# Patient Record
Sex: Male | Born: 1980
Health system: Southern US, Community
[De-identification: ages and names within clinical notes are randomized; demographics above are authoritative.]

## PROBLEM LIST (undated history)

## (undated) DIAGNOSIS — R1013 Epigastric pain: Secondary | ICD-10-CM

## (undated) DIAGNOSIS — R7303 Prediabetes: Secondary | ICD-10-CM

## (undated) HISTORY — DX: Prediabetes: R73.03

## (undated) HISTORY — PX: OTHER SURGICAL HISTORY: SHX169

## (undated) HISTORY — DX: Epigastric pain: R10.13

---

## 2013-05-31 HISTORY — PX: WISDOM TOOTH EXTRACTION: SHX21

## 2014-08-08 ENCOUNTER — Ambulatory Visit (INDEPENDENT_AMBULATORY_CARE_PROVIDER_SITE_OTHER): Payer: BLUE CROSS/BLUE SHIELD | Admitting: Family Medicine

## 2014-08-08 ENCOUNTER — Ambulatory Visit (INDEPENDENT_AMBULATORY_CARE_PROVIDER_SITE_OTHER): Payer: BLUE CROSS/BLUE SHIELD

## 2014-08-08 VITALS — BP 106/78 | HR 89 | Temp 98.4°F | Resp 12 | Ht 67.0 in | Wt 165.2 lb

## 2014-08-08 DIAGNOSIS — R05 Cough: Secondary | ICD-10-CM

## 2014-08-08 DIAGNOSIS — J209 Acute bronchitis, unspecified: Secondary | ICD-10-CM | POA: Diagnosis not present

## 2014-08-08 DIAGNOSIS — R059 Cough, unspecified: Secondary | ICD-10-CM

## 2014-08-08 MED ORDER — AZITHROMYCIN 250 MG PO TABS
ORAL_TABLET | ORAL | Status: DC
Start: 2014-08-08 — End: 2015-05-31

## 2014-08-08 NOTE — Progress Notes (Signed)
  Joseph Coleman - 34 y.o. male MRN [id number]  Date of birth: Mar 19, 1981  SUBJECTIVE: Chief Complaint  Patient presents with  . Cough    Dry  . Sore Throat    Dry    HPI:   2 weeks of worsening cough, nonproductive.  Subjective malaise and fatigue without fever.  No night sweats.  No TB contact.  No weight loss.  Works as a Quarry manager for [employer].   has tried NSAIDs and OTC therapy without significant improvement  No significant difficulty breathing, chest pain or lower extremity edema.      ROS: per HPI    HISTORY:  Past Medical, Surgical, Social, and Family History reviewed & updated per EMR.  Pertinent Historical Findings include:  reports that he has never smoked. He has never used smokeless tobacco. Otherwise relatively healthy   OBJECTIVE:  VS:   HT:5\' 7"  (170.2 cm)   WT:165 lb 4 oz (74.957 kg)  BMI:25.9          BP:106/78 mmHg  HR:89bpm  TEMP:98.4 F (36.9 C)(Oral)  RESP:98 %  Physical Exam  Constitutional: He is well-developed, well-nourished, and in no distress. No distress.  HENT:  Head: Normocephalic and atraumatic.  Right Ear: External ear normal.  Left Ear: External ear normal.  Eyes: Right eye exhibits no discharge. Left eye exhibits no discharge. No scleral icterus.  Neck: Normal range of motion. Neck supple. No JVD present. No tracheal deviation present.  Cardiovascular: Normal rate, regular rhythm and normal heart sounds.  Exam reveals no gallop and no friction rub.   No murmur heard. Pulmonary/Chest: Effort normal. No respiratory distress. He has no wheezes. He has rales (right lateral faint crackles, improves with coughing). He exhibits no tenderness.  Abdominal: Soft. He exhibits no distension. There is no tenderness. There is no rebound.  Musculoskeletal: He exhibits no edema or tenderness.  Lymphadenopathy:    He has no cervical adenopathy.  Neurological: He is alert.  Moves all 4 extremities spontaneously; no lateralization.    Skin: Skin is warm and dry. He is not diaphoretic.  Psychiatric: Mood, memory, affect and judgment normal.  Vitals reviewed.    DATA OBTAINED DURING VISIT: No results found for this or any previous visit.  Primary Read of Imaging by: Dr. Tamala Julian & Dr. Eldridge Abrahams Chest  08/08/2014  Findings: Increased interstitial markings, no focal consolidation. No air trapping. Normal osseous structures  Impression: The above process is consistent with a viral/atypical process. Otherwise normal X-ray     ASSESSMENT: 1. Cough   2. Acute bronchitis, unspecified organism     PLAN: See problem based charting & AVS for additional documentation.   azithromycin for 2 weeks of symptoms  Symptomatic treatment  Discussed expected clinical course.  > Return if symptoms worsen or fail to improve.

## 2014-08-08 NOTE — Patient Instructions (Signed)

## 2014-08-24 NOTE — Progress Notes (Signed)
History and physical examinations reviewed with Dr. Paulla Fore.  CXR reviewed during visit. Agree with assessment and plan. Kristi Elayne Guerin, M.D. [facility] [address] [place], [place]  [address] [phone] phone [phone] fax

## 2015-05-31 ENCOUNTER — Ambulatory Visit (INDEPENDENT_AMBULATORY_CARE_PROVIDER_SITE_OTHER): Payer: BLUE CROSS/BLUE SHIELD | Admitting: Urgent Care

## 2015-05-31 VITALS — BP 126/80 | HR 92 | Temp 98.3°F | Resp 16 | Ht 68.0 in | Wt 173.0 lb

## 2015-05-31 DIAGNOSIS — R059 Cough, unspecified: Secondary | ICD-10-CM

## 2015-05-31 DIAGNOSIS — J029 Acute pharyngitis, unspecified: Secondary | ICD-10-CM

## 2015-05-31 DIAGNOSIS — R05 Cough: Secondary | ICD-10-CM

## 2015-05-31 DIAGNOSIS — J069 Acute upper respiratory infection, unspecified: Secondary | ICD-10-CM

## 2015-05-31 MED ORDER — HYDROCODONE-HOMATROPINE 5-1.5 MG/5ML PO SYRP: 5.0000 mL | ORAL_SOLUTION | Freq: Every evening | ORAL | Status: DC | PRN

## 2015-05-31 MED ORDER — BENZONATATE 100 MG PO CAPS: 100.0000 mg | ORAL_CAPSULE | Freq: Three times a day (TID) | ORAL | Status: DC | PRN

## 2015-05-31 NOTE — Patient Instructions (Signed)
Upper Respiratory Infection, Adult Most upper respiratory infections (URIs) are a viral infection of the air passages leading to the lungs. A URI affects the nose, throat, and upper air passages. The most common type of URI is nasopharyngitis and is typically referred to as "the common cold." URIs run their course and usually go away on their own. Most of the time, a URI does not require medical attention, but sometimes a bacterial infection in the upper airways can follow a viral infection. This is called a secondary infection. Sinus and middle ear infections are common types of secondary upper respiratory infections. Bacterial pneumonia can also complicate a URI. A URI can worsen asthma and chronic obstructive pulmonary disease (COPD). Sometimes, these complications can require emergency medical care and may be life threatening.  CAUSES Almost all URIs are caused by viruses. A virus is a type of germ and can spread from one person to another.  RISKS FACTORS You may be at risk for a URI if:   You smoke.   You have chronic heart or lung disease.  You have a weakened defense (immune) system.   You are very young or very old.   You have nasal allergies or asthma.  You work in crowded or poorly ventilated areas.  You work in health care facilities or schools. SIGNS AND SYMPTOMS  Symptoms typically develop 2-3 days after you come in contact with a cold virus. Most viral URIs last 7-10 days. However, viral URIs from the influenza virus (flu virus) can last 14-18 days and are typically more severe. Symptoms may include:   Runny or stuffy (congested) nose.   Sneezing.   Cough.   Sore throat.   Headache.   Fatigue.   Fever.   Loss of appetite.   Pain in your forehead, behind your eyes, and over your cheekbones (sinus pain).  Muscle aches.  DIAGNOSIS  Your health care provider may diagnose a URI by:  Physical exam.  Tests to check that your symptoms are not due to  another condition such as:  Strep throat.  Sinusitis.  Pneumonia.  Asthma. TREATMENT  A URI goes away on its own with time. It cannot be cured with medicines, but medicines may be prescribed or recommended to relieve symptoms. Medicines may help:  Reduce your fever.  Reduce your cough.  Relieve nasal congestion. HOME CARE INSTRUCTIONS   Take medicines only as directed by your health care provider.   Gargle warm saltwater or take cough drops to comfort your throat as directed by your health care provider.  Use a warm mist humidifier or inhale steam from a shower to increase air moisture. This may make it easier to breathe.  Drink enough fluid to keep your urine clear or pale yellow.   Eat soups and other clear broths and maintain good nutrition.   Rest as needed.   Return to work when your temperature has returned to normal or as your health care provider advises. You may need to stay home longer to avoid infecting others. You can also use a face mask and careful hand washing to prevent spread of the virus.  Increase the usage of your inhaler if you have asthma.   Do not use any tobacco products, including cigarettes, chewing tobacco, or electronic cigarettes. If you need help quitting, ask your health care provider. PREVENTION  The best way to protect yourself from getting a cold is to practice good hygiene.   Avoid oral or hand contact with people with cold   symptoms.   Wash your hands often if contact occurs.  There is no clear evidence that vitamin C, vitamin E, echinacea, or exercise reduces the chance of developing a cold. However, it is always recommended to get plenty of rest, exercise, and practice good nutrition.  SEEK MEDICAL CARE IF:   You are getting worse rather than better.   Your symptoms are not controlled by medicine.   You have chills.  You have worsening shortness of breath.  You have brown or red mucus.  You have yellow or brown nasal  discharge.  You have pain in your face, especially when you bend forward.  You have a fever.  You have swollen neck glands.  You have pain while swallowing.  You have white areas in the back of your throat. SEEK IMMEDIATE MEDICAL CARE IF:   You have severe or persistent:  Headache.  Ear pain.  Sinus pain.  Chest pain.  You have chronic lung disease and any of the following:  Wheezing.  Prolonged cough.  Coughing up blood.  A change in your usual mucus.  You have a stiff neck.  You have changes in your:  Vision.  Hearing.  Thinking.  Mood. MAKE SURE YOU:   Understand these instructions.  Will watch your condition.  Will get help right away if you are not doing well or get worse.   This information is not intended to replace advice given to you by your health care provider. Make sure you discuss any questions you have with your health care provider.   Document Released: 11/10/2000 Document Revised: 10/01/2014 Document Reviewed: 08/22/2013 Elsevier Interactive Patient Education 2016 Elsevier Inc.  

## 2015-05-31 NOTE — Progress Notes (Signed)
    MRN: [id number] DOB: 1980-06-24  Subjective:   Joseph Coleman is a 34 y.o. male presenting for chief complaint of Sinus Problem and Sore Throat  Reports 4 day history of worsening sore throat, sinus congestion, subjective fever, productive cough, throat congestion, feels eyes heavy. Has tried ibuprofen for chills/fever with some relief. Has also NyQuil and Theraflu with minimal relief. Denies chest pain, shob, chest tightness, n/v, abdominal pain, itchy or red eyes, ear pain, sinus pain, tooth pain. Has a history of allergies, does not take medications for this consistently. Denies history of asthma. Denies smoking cigarettes.  Joseph Coleman currently has no medications in their medication list. Also has No Known Allergies.  Joseph Coleman  has no past medical history on file. Also  has no past surgical history on file.  Objective:   Vitals: BP 126/80 mmHg  Pulse 92  Temp(Src) 98.3 F (36.8 C) (Oral)  Resp 16  Ht 5\' 8"  (1.727 m)  Wt 173 lb (78.472 kg)  BMI 26.31 kg/m2  SpO2 99%  Physical Exam  Constitutional: He is oriented to person, place, and time. He appears well-developed and well-nourished.  HENT:  TM's intact bilaterally, no effusions or erythema. Nasal turbinates inflamed with mucus. No sinus tenderness. Slight postnasal drip present, without oropharyngeal exudates, erythema or abscesses.  Eyes: Right eye exhibits no discharge. Left eye exhibits no discharge. No scleral icterus.  Neck: Normal range of motion. Neck supple.  Cardiovascular: Normal rate, regular rhythm and intact distal pulses.  Exam reveals no gallop and no friction rub.   No murmur heard. Pulmonary/Chest: No respiratory distress. He has no wheezes. He has no rales.  Lymphadenopathy:    He has no cervical adenopathy.  Neurological: He is alert and oriented to person, place, and time.  Skin: Skin is warm and dry. No rash noted. No erythema. No pallor.   Assessment and Plan :   1. Upper respiratory infection 2.  Cough 3. Sore throat - Likely undergoing viral syndrome. Recommended supportive care, Hycodan and Tessalon for cough and sore throat. Rtc in 1 week if no improvement.  Jaynee Eagles, PA-C [facility] [phone] 05/31/2015 9:16 AM

## 2015-06-01 DIAGNOSIS — R1013 Epigastric pain: Secondary | ICD-10-CM

## 2015-06-01 HISTORY — DX: Epigastric pain: R10.13

## 2015-06-03 ENCOUNTER — Telehealth: Payer: Self-pay

## 2015-06-03 NOTE — Telephone Encounter (Signed)
Pt states the medication he was given isn't helping at all, would like to speak with someone regarding his symptoms and perhaps call him something else in Please call [facility]

## 2015-06-04 NOTE — Telephone Encounter (Signed)
Spoke with pt, he states he he feeling better.

## 2015-12-31 ENCOUNTER — Emergency Department (HOSPITAL_BASED_OUTPATIENT_CLINIC_OR_DEPARTMENT_OTHER)
Admission: EM | Admit: 2015-12-31 | Discharge: 2015-12-31 | Disposition: A | Discharge status: Home / Self Care | Payer: BLUE CROSS/BLUE SHIELD | Attending: Physician Assistant | Admitting: Physician Assistant

## 2015-12-31 ENCOUNTER — Encounter (HOSPITAL_BASED_OUTPATIENT_CLINIC_OR_DEPARTMENT_OTHER): Payer: Self-pay | Admitting: *Deleted

## 2015-12-31 DIAGNOSIS — R1013 Epigastric pain: Secondary | ICD-10-CM | POA: Insufficient documentation

## 2015-12-31 MED ORDER — OMEPRAZOLE 20 MG PO CPDR: 20.0000 mg | DELAYED_RELEASE_CAPSULE | Freq: Every day | ORAL | 5 refills | Status: DC

## 2015-12-31 MED FILL — OMEPRAZOLE DR 20 MG CAPSULE: 20 | 30 days supply | Qty: 30 | Fill #0

## 2015-12-31 NOTE — Discharge Instructions (Signed)
Read the information below.   EKG shows a normal rhythm. I am prescribing an proton pump inhibitor to help with the epigastric pain. Take as directed.  Use the prescribed medication as directed.  Please discuss all new medications with your pharmacist.   Be sure to follow up with your primary care provider next week as stated for re-evaluation.  You may return to the Emergency Department at any time for worsening condition or any new symptoms that concern you. Return to ED if you develop chest pain, shortness of breath, vomiting, blood in stool, blood in vomit, loss of consciousness.

## 2015-12-31 NOTE — ED Triage Notes (Signed)
Pt reports epigastric pressure x Monday. Pt states "it's like when you eat something and it gets stuck..." denies any other c/o at this time.

## 2015-12-31 NOTE — ED Provider Notes (Signed)
[facility] DEPT [facility] Provider Note   CSN: [id number] Arrival date & time: 12/31/15  1019  First Provider Contact:  First MD Initiated Contact with Patient 12/31/15 1106        History   Chief Complaint Chief Complaint  Patient presents with  . Abdominal Pain    HPI Joseph Coleman is a 35 y.o. male.  Joseph Coleman is a 35 y.o. male presents to the emergency department with complaint of epigastric pain. Patient states that pain started on Monday while he was doing laundry after drinking coffee. The pain is intermittent and described as "swallowed something large and lingering" in the lower esophagus. Patient denies radiation of pain. No fever, chest pain, shortness of breath, sore throat, changes in vision, nausea, vomiting, diarrhea, blood in stool, dysuria, hematuria, rashes, headache, neck pain, back pain. No trauma. Patient does endorse some spicy food over the weekend while he was out of town. He endorses that and affect seem to help a little bit. Patient states pain seems to be worse first thing in the morning. Unclear if food affects severity of pain. No history of acid reflux. No history of cardiac conditions. No chronic medical conditions.      History reviewed. No pertinent past medical history.  There are no active problems to display for this patient.   History reviewed. No pertinent surgical history.     Home Medications    Prior to Admission medications   Not on File    Family History Family History  Problem Relation Age of Onset  . Diabetes Father     Social History Social History  Substance Use Topics  . Smoking status: Never Smoker  . Smokeless tobacco: Never Used  . Alcohol use No     Allergies   Review of patient's allergies indicates no known allergies.   Review of Systems Review of Systems  Gastrointestinal: Positive for abdominal pain.  All other systems reviewed and are negative.    Physical Exam Updated Vital Signs BP  128/88 (BP Location: Left Arm)   Pulse 77   Temp 97.8 F (36.6 C) (Oral)   Resp 18   Ht 5\' 8"  (1.727 m)   Wt 80.7 kg   SpO2 100%   BMI 27.06 kg/m   Physical Exam  Constitutional: He appears well-developed and well-nourished. No distress.  HENT:  Head: Normocephalic and atraumatic.  Mouth/Throat: Oropharynx is clear and moist. No oropharyngeal exudate.  Eyes: Conjunctivae and EOM are normal. Pupils are equal, round, and reactive to light. Right eye exhibits no discharge. Left eye exhibits no discharge. No scleral icterus.  Neck: Normal range of motion. Neck supple.  Cardiovascular: Normal rate, regular rhythm, normal heart sounds and intact distal pulses.   No murmur heard. Pulmonary/Chest: Effort normal and breath sounds normal. No respiratory distress.  Abdominal: Soft. Bowel sounds are normal. There is no tenderness. There is no rebound and no guarding.  Musculoskeletal: Normal range of motion.  Lymphadenopathy:    He has no cervical adenopathy.  Neurological: He is alert. Coordination normal.  Skin: Skin is warm and dry. He is not diaphoretic.  Psychiatric: He has a normal mood and affect. His behavior is normal.     ED Treatments / Results  Labs (all labs ordered are listed, but only abnormal results are displayed) Labs Reviewed - No data to display  EKG  EKG Interpretation  Date/Time:  Wednesday December 31 2015 10:33:08 EDT Ventricular Rate:  84 PR Interval:    QRS Duration: 90  QT Interval:  346 QTC Calculation: 409 R Axis:   89 Text Interpretation:  Sinus rhythm Baseline wander in lead(s) I Normal sinus rhythm Confirmed by Thomasene Lot, [place] ([id number]) on 12/31/2015 11:17:05 AM       Radiology No results found.  Procedures Procedures (including critical care time)  Medications Ordered in ED Medications - No data to display   Initial Impression / Assessment and Plan / ED Course  I have reviewed the triage vital signs and the nursing notes.  Pertinent labs  & imaging results that were available during my care of the patient were reviewed by me and considered in my medical decision making (see chart for details).  Clinical Course    Patient presents to ED with complaint of intermittent epigastric pain. He denies chest pain and SOB. No chronic medical conditions. Patient is afebrile and nontoxic-appearing in NAD. Vital signs are stable. Physical exam is reassuring - normal rate and rhythm, no murmurs, lungs are clear to auscultation, abdomen is soft and nontender with positive bowel sounds.. EKG shows normal sinus rhythm. Given history and physical exam, suspect this may be acid reflux vs esophageal spasm. Low suspicion for cholecystitis, pancreatitis, PNA, AMI. Will treat with omeprazole. Patient is scheduled to see PCP next Tuesday. Discussed return precautions. Patient voices understanding and is agreeable.    Final Clinical Impressions(s) / ED Diagnoses   Final diagnoses:  Epigastric abdominal pain    New Prescriptions New Prescriptions   No medications on file     Roxanna Mew, PA-C 12/31/15 [place], MD 12/31/15 1451

## 2016-01-05 ENCOUNTER — Encounter: Payer: Self-pay | Admitting: Behavioral Health

## 2016-01-05 ENCOUNTER — Telehealth: Payer: Self-pay | Admitting: Behavioral Health

## 2016-01-05 NOTE — Telephone Encounter (Signed)
Pre-Visit Call completed with patient and chart updated.   Pre-Visit Info documented in Specialty Comments under [facility].    

## 2016-01-06 ENCOUNTER — Ambulatory Visit (INDEPENDENT_AMBULATORY_CARE_PROVIDER_SITE_OTHER): Payer: BLUE CROSS/BLUE SHIELD | Admitting: Physician Assistant

## 2016-01-06 ENCOUNTER — Encounter: Payer: Self-pay | Admitting: Physician Assistant

## 2016-01-06 VITALS — BP 96/68 | HR 89 | Temp 98.1°F | Resp 16 | Ht 68.0 in | Wt 175.2 lb

## 2016-01-06 DIAGNOSIS — K219 Gastro-esophageal reflux disease without esophagitis: Secondary | ICD-10-CM | POA: Diagnosis not present

## 2016-01-06 MED ORDER — RANITIDINE HCL 150 MG PO TABS: 150.0000 mg | ORAL_TABLET | Freq: Two times a day (BID) | ORAL | 0 refills | Status: DC

## 2016-01-06 MED ORDER — CLOTRIMAZOLE-BETAMETHASONE 1-0.05 % EX CREA: 1.0000 "application " | TOPICAL_CREAM | Freq: Two times a day (BID) | CUTANEOUS | 0 refills | Status: DC

## 2016-01-06 NOTE — Patient Instructions (Addendum)
Please stop the Prilosec. Start the Ranitidine twice daily over the next two weeks. Follow the diet below.  Apply the cream to the chest as directed. Follow-up if symptoms are not resolving.  Food Choices for Gastroesophageal Reflux Disease, Adult When you have gastroesophageal reflux disease (GERD), the foods you eat and your eating habits are very important. Choosing the right foods can help ease the discomfort of GERD. WHAT GENERAL GUIDELINES DO I NEED TO FOLLOW?  Choose fruits, vegetables, whole grains, low-fat dairy products, and low-fat meat, fish, and poultry.  Limit fats such as oils, salad dressings, butter, nuts, and avocado.  Keep a food diary to identify foods that cause symptoms.  Avoid foods that cause reflux. These may be different for different people.  Eat frequent small meals instead of three large meals each day.  Eat your meals slowly, in a relaxed setting.  Limit fried foods.  Cook foods using methods other than frying.  Avoid drinking alcohol.  Avoid drinking large amounts of liquids with your meals.  Avoid bending over or lying down until 2-3 hours after eating. WHAT FOODS ARE NOT RECOMMENDED? The following are some foods and drinks that may worsen your symptoms: Vegetables Tomatoes. Tomato juice. Tomato and spaghetti sauce. Chili peppers. Onion and garlic. Horseradish. Fruits Oranges, grapefruit, and lemon (fruit and juice). Meats High-fat meats, fish, and poultry. This includes hot dogs, ribs, ham, sausage, salami, and bacon. Dairy Whole milk and chocolate milk. Sour cream. Cream. Butter. Ice cream. Cream cheese.  Beverages Coffee and tea, with or without caffeine. Carbonated beverages or energy drinks. Condiments Hot sauce. Barbecue sauce.  Sweets/Desserts Chocolate and cocoa. Donuts. Peppermint and spearmint. Fats and Oils High-fat foods, including [place] fries and potato chips. Other Vinegar. Strong spices, such as black pepper, white  pepper, red pepper, cayenne, curry powder, cloves, ginger, and chili powder. The items listed above may not be a complete list of foods and beverages to avoid. Contact your dietitian for more information.   This information is not intended to replace advice given to you by your health care provider. Make sure you discuss any questions you have with your health care provider.   Document Released: 05/17/2005 Document Revised: 06/07/2014 Document Reviewed: 03/21/2013 Elsevier Interactive Patient Education [employer].

## 2016-01-06 NOTE — Progress Notes (Signed)
Patient presents to clinic today to establish care.  Acute Concerns: Patient is also an ER follow-up. Presented to ER on 12/31/2015 c/o heartburn and epigastric discomfort.  ER workup included EKG revealing normal sinus rhythm. Giving patient's normal EKG and low risk for acute abdomen, patient was started on omeprazole 20 mg daily for GERD. Since discharge, patient denies any recurrence of symptoms. He is taking omeprazole daily as directed, but notes it makes him very fatigued. Does note the medication is preventing any heartburn or indigestion. Denies change to bowel habits. Denies melena or hematochezia. Denies fever, chills, malaise or fatigue. Feels very well overall.   Past Medical History:  Diagnosis Date  . Epigastric pain 2017    Past Surgical History:  Procedure Laterality Date  . WISDOM TOOTH EXTRACTION  2015    Current Outpatient Prescriptions on File Prior to Visit  Medication Sig Dispense Refill  . omeprazole (PRILOSEC) 20 MG capsule Take 1 capsule (20 mg total) by mouth daily. 30 capsule 5   No current facility-administered medications on file prior to visit.     No Known Allergies  Family History  Problem Relation Age of Onset  . Hyperlipidemia Mother   . Hypertension Mother   . Diabetes Father   . Alcohol abuse Sister   . Alcohol abuse Brother   . Stroke Maternal Grandfather   . Heart attack Paternal Aunt   . Cerebral palsy Son 8  . Premature birth Son 37  . Healthy Daughter 2  . Cancer Neg Hx     Social History   Social History  . Marital status: Married    Spouse name: N/A  . Number of children: N/A  . Years of education: N/A   Occupational History  . Not on file.   Social History Main Topics  . Smoking status: Never Smoker  . Smokeless tobacco: Never Used  . Alcohol use No  . Drug use: No  . Sexual activity: Not on file   Other Topics Concern  . Not on file   Social History Narrative  . No narrative on file    Review of  Systems  Constitutional: Positive for malaise/fatigue. Negative for chills and fever.  Eyes: Negative for blurred vision.  Respiratory: Negative for cough and shortness of breath.   Cardiovascular: Negative for chest pain and palpitations.  Gastrointestinal: Positive for abdominal pain and heartburn. Negative for blood in stool, constipation, diarrhea, melena, nausea and vomiting.  Genitourinary: Negative for dysuria, flank pain, frequency, hematuria and urgency.  Neurological: Negative for headaches.    BP 96/68 (BP Location: Left Arm, Patient Position: Sitting, Cuff Size: Large)   Pulse 89   Temp 98.1 F (36.7 C) (Oral)   Resp 16   Ht 5\' 8"  (1.727 m)   Wt 175 lb 4 oz (79.5 kg)   SpO2 98%   BMI 26.65 kg/m   Physical Exam  Constitutional: He is oriented to person, place, and time and well-developed, well-nourished, and in no distress.  HENT:  Head: Normocephalic and atraumatic.  Eyes: Conjunctivae are normal.  Neck: Neck supple.  Cardiovascular: Normal rate, regular rhythm, normal heart sounds and intact distal pulses.   Pulmonary/Chest: Effort normal and breath sounds normal. No respiratory distress. He has no wheezes. He has no rales. He exhibits no tenderness.  Abdominal: Soft. Bowel sounds are normal. He exhibits no distension and no mass. There is no tenderness. There is no rebound and no guarding.  Neurological: He is alert and oriented to  person, place, and time.  Skin: Skin is warm and dry. No rash noted.  Psychiatric: Affect normal.  Vitals reviewed.  Assessment/Plan: 1. Gastroesophageal reflux disease without esophagitis Recent flareup as cause of epigastric discomfort. Symptoms have resolved with Prilosec. Unfortunately patient unable to tolerate Prilosec due to side effects. We'll stop and switch to Zantac 150 mg twice daily x 2 weeks, then PRN. Patient to start daily probiotic. GERD diet discussed. Handout given. Follow-up when necessary if symptoms recur. He is also  encouraged to schedule complete physical.   Leeanne Rio, PA-C

## 2017-08-25 DIAGNOSIS — J209 Acute bronchitis, unspecified: Secondary | ICD-10-CM | POA: Diagnosis not present

## 2017-10-19 ENCOUNTER — Encounter: Payer: Self-pay | Admitting: General Practice

## 2017-11-15 ENCOUNTER — Telehealth: Payer: Self-pay | Admitting: *Deleted

## 2017-11-15 NOTE — Telephone Encounter (Signed)
Ok to transfer. But don't change until he actually come in to be seen. He can come in for establish patient visit.if no acute complaints might be able to do wellness exam.

## 2017-11-15 NOTE — Telephone Encounter (Signed)
[place] with me. I have only seen patient once in 2017 for an acute.

## 2017-11-15 NOTE — Telephone Encounter (Signed)
Copied from [place] [phone]. Topic: Appointment Scheduling - Prior Auth Required for Appointment >> Nov 15, 2017 11:02 AM [name] wrote: Patient is requesting transfer of care appointment from [place].  Pt did not know Joseph Coleman had moved to [place] and that is too far for him to go,. Is that ok with you Joseph Coleman? OK Percell Miller?   Thank you!!

## 2017-11-23 ENCOUNTER — Encounter: Payer: BLUE CROSS/BLUE SHIELD | Admitting: Physician Assistant

## 2017-12-07 ENCOUNTER — Encounter: Payer: BLUE CROSS/BLUE SHIELD | Admitting: Medical

## 2018-02-02 DIAGNOSIS — J209 Acute bronchitis, unspecified: Secondary | ICD-10-CM | POA: Diagnosis not present

## 2018-02-14 ENCOUNTER — Ambulatory Visit (INDEPENDENT_AMBULATORY_CARE_PROVIDER_SITE_OTHER): Payer: BLUE CROSS/BLUE SHIELD | Admitting: Medical

## 2018-02-14 ENCOUNTER — Encounter: Payer: Self-pay | Admitting: Medical

## 2018-02-14 VITALS — BP 113/70 | HR 71 | Temp 98.4°F | Resp 16 | Ht 68.0 in | Wt 143.0 lb

## 2018-02-14 DIAGNOSIS — Z113 Encounter for screening for infections with a predominantly sexual mode of transmission: Secondary | ICD-10-CM | POA: Diagnosis not present

## 2018-02-14 DIAGNOSIS — Z0000 Encounter for general adult medical examination without abnormal findings: Secondary | ICD-10-CM | POA: Diagnosis not present

## 2018-02-14 LAB — CBC WITH DIFFERENTIAL/PLATELET
Basophils Absolute: 0 10*3/uL (ref 0.0–0.1)
Basophils Relative: 0.5 % (ref 0.0–3.0)
EOS ABS: 0.1 10*3/uL (ref 0.0–0.7)
Eosinophils Relative: 1.2 % (ref 0.0–5.0)
HCT: 43.7 % (ref 39.0–52.0)
HEMOGLOBIN: 13.9 g/dL (ref 13.0–17.0)
LYMPHS ABS: 2 10*3/uL (ref 0.7–4.0)
Lymphocytes Relative: 31.2 % (ref 12.0–46.0)
MCHC: 31.9 g/dL (ref 30.0–36.0)
MCV: 66.4 fl — ABNORMAL LOW (ref 78.0–100.0)
MONO ABS: 0.6 10*3/uL (ref 0.1–1.0)
Monocytes Relative: 9.9 % (ref 3.0–12.0)
NEUTROS PCT: 57.2 % (ref 43.0–77.0)
Neutro Abs: 3.7 10*3/uL (ref 1.4–7.7)
Platelets: 299 10*3/uL (ref 150.0–400.0)
RBC: 6.58 Mil/uL — AB (ref 4.22–5.81)
RDW: 14.7 % (ref 11.5–15.5)
WBC: 6.4 10*3/uL (ref 4.0–10.5)

## 2018-02-14 LAB — POC URINALSYSI DIPSTICK (AUTOMATED)
Bilirubin, UA: NEGATIVE
GLUCOSE UA: NEGATIVE
Leukocytes, UA: NEGATIVE
NITRITE UA: NEGATIVE
PH UA: 6 (ref 5.0–8.0)
Protein, UA: NEGATIVE
RBC UA: NEGATIVE
Spec Grav, UA: >=1.03 — AB (ref 1.010–1.025)
UROBILINOGEN UA: NEGATIVE U/dL — AB

## 2018-02-14 LAB — LIPID PANEL
CHOLESTEROL: 190 mg/dL (ref 0–200)
HDL: 54.9 mg/dL (ref >39.00)
LDL Cholesterol: 121 mg/dL — ABNORMAL HIGH (ref 0–99)
NonHDL: 134.89
TRIGLYCERIDES: 69 mg/dL (ref 0.0–149.0)
Total CHOL/HDL Ratio: 3
VLDL: 13.8 mg/dL (ref 0.0–40.0)

## 2018-02-14 LAB — COMPREHENSIVE METABOLIC PANEL
ALBUMIN: 4.9 g/dL (ref 3.5–5.2)
ALK PHOS: 56 U/L (ref 39–117)
ALT: 18 U/L (ref 0–53)
AST: 19 U/L (ref 0–37)
BUN: 15 mg/dL (ref 6–23)
CHLORIDE: 102 meq/L (ref 96–112)
CO2: 27 mEq/L (ref 19–32)
Calcium: 10 mg/dL (ref 8.4–10.5)
Creatinine, Ser: 1.06 mg/dL (ref 0.40–1.50)
GFR: 83.02 mL/min (ref >60.00)
GLUCOSE: 92 mg/dL (ref 70–99)
POTASSIUM: 4.2 meq/L (ref 3.5–5.1)
SODIUM: 139 meq/L (ref 135–145)
TOTAL PROTEIN: 7.8 g/dL (ref 6.0–8.3)
Total Bilirubin: 0.9 mg/dL (ref 0.2–1.2)

## 2018-02-14 MED ORDER — HYDROCODONE-HOMATROPINE 5-1.5 MG/5ML PO SYRP: 5.0000 mL | ORAL_SOLUTION | Freq: Three times a day (TID) | ORAL | 0 refills | Status: DC | PRN

## 2018-02-14 NOTE — Patient Instructions (Addendum)
For you wellness exam today I have ordered cbc, cmp, lipid panel, ua and hiv.  Vaccine given today none. Pt declined those today.  Recommend exercise and healthy diet.  We will let you know lab results as they come in.  Follow up date appointment will be determined after lab review.   Will give limited supply of hycodan for your cough so you can sleep. If cough persists past rx please let me know.   Preventive Care 18-39 Years, Male Preventive care refers to lifestyle choices and visits with your health care provider that can promote health and wellness. What does preventive care include?  A yearly physical exam. This is also called an annual well check.  Dental exams once or twice a year.  Routine eye exams. Ask your health care provider how often you should have your eyes checked.  Personal lifestyle choices, including: ? Daily care of your teeth and gums. ? Regular physical activity. ? Eating a healthy diet. ? Avoiding tobacco and drug use. ? Limiting alcohol use. ? Practicing safe sex. What happens during an annual well check? The services and screenings done by your health care provider during your annual well check will depend on your age, overall health, lifestyle risk factors, and family history of disease. Counseling Your health care provider may ask you questions about your:  Alcohol use.  Tobacco use.  Drug use.  Emotional well-being.  Home and relationship well-being.  Sexual activity.  Eating habits.  Work and work Statistician.  Screening You may have the following tests or measurements:  Height, weight, and BMI.  Blood pressure.  Lipid and cholesterol levels. These may be checked every 5 years starting at age 83.  Diabetes screening. This is done by checking your blood sugar (glucose) after you have not eaten for a while (fasting).  Skin check.  Hepatitis C blood test.  Hepatitis B blood test.  Sexually transmitted disease (STD)  testing.  Discuss your test results, treatment options, and if necessary, the need for more tests with your health care provider. Vaccines Your health care provider may recommend certain vaccines, such as:  Influenza vaccine. This is recommended every year.  Tetanus, diphtheria, and acellular pertussis (Tdap, Td) vaccine. You may need a Td booster every 10 years.  Varicella vaccine. You may need this if you have not been vaccinated.  HPV vaccine. If you are 22 or younger, you may need three doses over 6 months.  Measles, mumps, and rubella (MMR) vaccine. You may need at least one dose of MMR.You may also need a second dose.  Pneumococcal 13-valent conjugate (PCV13) vaccine. You may need this if you have certain conditions and have not been vaccinated.  Pneumococcal polysaccharide (PPSV23) vaccine. You may need one or two doses if you smoke cigarettes or if you have certain conditions.  Meningococcal vaccine. One dose is recommended if you are age 83-21 years and a first-year college student living in a residence hall, or if you have one of several medical conditions. You may also need additional booster doses.  Hepatitis A vaccine. You may need this if you have certain conditions or if you travel or work in places where you may be exposed to hepatitis A.  Hepatitis B vaccine. You may need this if you have certain conditions or if you travel or work in places where you may be exposed to hepatitis B.  Haemophilus influenzae type b (Hib) vaccine. You may need this if you have certain risk factors.  Talk  to your health care provider about which screenings and vaccines you need and how often you need them. This information is not intended to replace advice given to you by your health care provider. Make sure you discuss any questions you have with your health care provider. Document Released: 07/13/2001 Document Revised: 02/04/2016 Document Reviewed: 03/18/2015 Elsevier Interactive Patient  Education  [employer].

## 2018-02-14 NOTE — Progress Notes (Signed)
Subjective:    Patient ID: Joseph Coleman, male    DOB: [birth year], 37 y.o.   MRN: [id number]  HPI  Pt in for first time.  Pt states he has been coughing recently(see ros).   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 Pt dad passed away and he was a Archivist. Pt wife just changing jobs.  Pt does exercise regularly.   Pt states tdap done about 5 years ago.  Pt declines flu vaccine.    Review of Systems  Constitutional: Negative for chills, fatigue and fever.  HENT: Negative for congestion, hearing loss, nosebleeds and postnasal drip.   Respiratory: Positive for cough. Negative for wheezing.        He updates me that he has 2 weeks of chest congestion and cough. Pt went to UC. He was first treated with azithromycin then treated with levofloxin. Pt given benzonatate. Also prednisone taper course.   He states some residual cough now but getting better now.  Cough still disrupts his sleep.  Cardiovascular: Negative for chest pain.  Gastrointestinal: Negative for abdominal distention, abdominal pain, blood in stool, constipation, nausea and vomiting.  Genitourinary: Negative for dysuria, flank pain and frequency.  Musculoskeletal: Negative for back pain, myalgias and neck pain.  Skin: Negative for rash.  Neurological: Negative for dizziness, speech difficulty, weakness, numbness and headaches.  Hematological: Negative for adenopathy. Does not bruise/bleed easily.  Psychiatric/Behavioral: Negative for behavioral problems, confusion, dysphoric mood, sleep disturbance and suicidal ideas. The patient is not nervous/anxious.     Past Medical History:  Diagnosis Date  . Epigastric pain 2017     Social History   Socioeconomic History  . Marital status: Married    Spouse name: Not on file  . Number of children: 2  . Years of education: Not on file  . Highest education level: Not on file  Occupational History  . Not on file  Social Needs  . Financial  resource strain: Not on file  . Food insecurity:    Worry: Not on file    Inability: Not on file  . Transportation needs:    Medical: Not on file    Non-medical: Not on file  Tobacco Use  . Smoking status: Never Smoker  . Smokeless tobacco: Never Used  Substance and Sexual Activity  . Alcohol use: No    Alcohol/week: 0.0 standard drinks  . Drug use: No  . Sexual activity: Yes    Partners: Female  Lifestyle  . Physical activity:    Days per week: Not on file    Minutes per session: Not on file  . Stress: Not on file  Relationships  . Social connections:    Talks on phone: Not on file    Gets together: Not on file    Attends religious service: Not on file    Active member of club or organization: Not on file    Attends meetings of clubs or organizations: Not on file    Relationship status: Not on file  . Intimate partner violence:    Fear of current or ex partner: Not on file    Emotionally abused: Not on file    Physically abused: Not on file    Forced sexual activity: Not on file  Other Topics Concern  . Not on file  Social History Narrative  . Not on file    Past Surgical History:  Procedure Laterality Date  . lipoma removal    .  WISDOM TOOTH EXTRACTION  2015    Family History  Problem Relation Age of Onset  . Hyperlipidemia Mother   . Hypertension Mother   . Diabetes Father   . Alcohol abuse Sister   . Stroke Maternal Grandfather   . Heart attack Paternal Aunt   . Cerebral palsy Son 8  . Premature birth Son 79  . Healthy Daughter 2  . Cancer Neg Hx     No Known Allergies  Current Outpatient Medications on File Prior to Visit  Medication Sig Dispense Refill  . benzonatate (TESSALON) 200 MG capsule TK 1 C PO TID PRF COUGH  0  . clotrimazole-betamethasone (LOTRISONE) cream Apply 1 application topically 2 (two) times daily. 30 g 0  . levofloxacin (LEVAQUIN) 500 MG tablet   0  . ranitidine (ZANTAC) 150 MG tablet Take 1 tablet (150 mg total) by mouth 2  (two) times daily. 60 tablet 0   No current facility-administered medications on file prior to visit.     BP 113/70   Pulse 71   Temp 98.4 F (36.9 C) (Oral)   Resp 16   Ht 5\' 8"  (1.727 m)   Wt 143 lb (64.9 kg)   SpO2 100%   BMI 21.74 kg/m       Objective:   Physical Exam  General Mental Status- Alert. General Appearance- Not in acute distress.   Skin General: Color- Normal Color. Moisture- Normal Moisture.  Neck Carotid Arteries- Normal color. Moisture- Normal Moisture. No carotid bruits. No JVD.  Chest and Lung Exam Auscultation: Breath Sounds:-Normal.  Cardiovascular Auscultation:Rythm- Regular. Murmurs & Other Heart Sounds:Auscultation of the heart reveals- No Murmurs.  Abdomen Inspection:-Inspeection Normal. Palpation/Percussion:Note:No mass. Palpation and Percussion of the abdomen reveal- Non Tender, Non Distended + BS, no rebound or guarding.  Neurologic Cranial Nerve exam:- CN III-XII intact(No nystagmus), symmetric smile. Drift Test:- No drift. Romberg Exam:- Negative.  Heal to Toe Gait exam:-Normal. Finger to Nose:- Normal/Intact Strength:- 5/5 equal and symmetric strength both upper and lower extremities.      Assessment & Plan:  For you wellness exam today I have ordered cbc, cmp, lipid panel, ua and hiv.  Vaccine given today none. Pt declined those today.  Recommend exercise and healthy diet.  We will let you know lab results as they come in.  Follow up date appointment will be determined after lab review.   Will give limited supply of hycodan for your cough so you can sleep.  Mackie Pai, PA-C

## 2018-02-15 LAB — HIV ANTIBODY (ROUTINE TESTING W REFLEX): HIV 1&2 Ab, 4th Generation: NONREACTIVE

## 2018-02-23 ENCOUNTER — Telehealth: Payer: Self-pay | Admitting: Medical

## 2018-02-23 NOTE — Telephone Encounter (Signed)
Copied from [place] [id number]. Topic: Quick Communication - See Telephone Encounter >> Feb 23, 2018  8:31 AM Alfredia Ferguson R wrote: Pt called in stating that he still is having the rough cough and was told to call back in and advise Dr Harvie Heck if cough hasnt gotten any better. He states he wants to know if Dr Harvie Heck wants him to come back in to be seen by him or can he send something else to help him with the cough.  Cb# [phone] (mobile)

## 2018-02-24 MED ORDER — HYDROCODONE-HOMATROPINE 5-1.5 MG/5ML PO SYRP: 5.0000 mL | ORAL_SOLUTION | Freq: Three times a day (TID) | ORAL | 0 refills | Status: DC | PRN

## 2018-02-24 NOTE — Telephone Encounter (Signed)
Talked with pt. Rx'd hycodan and asked to call to schedule appointment on Monday afternoon. Plan to recheck and get chest xray.

## 2018-04-26 ENCOUNTER — Encounter: Payer: Self-pay | Admitting: Family Medicine

## 2018-04-26 ENCOUNTER — Ambulatory Visit: Payer: BLUE CROSS/BLUE SHIELD | Admitting: Family Medicine

## 2018-04-26 VITALS — BP 110/78 | HR 78 | Temp 98.2°F | Ht 68.0 in | Wt 155.1 lb

## 2018-04-26 DIAGNOSIS — J069 Acute upper respiratory infection, unspecified: Secondary | ICD-10-CM | POA: Diagnosis not present

## 2018-04-26 DIAGNOSIS — B9789 Other viral agents as the cause of diseases classified elsewhere: Secondary | ICD-10-CM

## 2018-04-26 MED ORDER — PROMETHAZINE-DM 6.25-15 MG/5ML PO SYRP: 5.0000 mL | ORAL_SOLUTION | Freq: Four times a day (QID) | ORAL | 0 refills | Status: DC | PRN

## 2018-04-26 MED ORDER — BENZONATATE 200 MG PO CAPS: ORAL_CAPSULE | ORAL | 0 refills | Status: DC

## 2018-04-26 NOTE — Patient Instructions (Signed)
Continue to push fluids, practice good hand hygiene, and cover your mouth if you cough.  If you start having fevers, shaking or shortness of breath, seek immediate care.  For symptoms, consider using Vick's VapoRub on chest or under nose, air humidifier, Benadryl at night, and elevating the head of the bed. Tylenol and ibuprofen for aches and pains you may be experiencing.   Let us know if you need anything.  

## 2018-04-26 NOTE — Progress Notes (Signed)
Pre visit review using our clinic review tool, if applicable. No additional management support is needed unless otherwise documented below in the visit note. 

## 2018-04-26 NOTE — Progress Notes (Signed)
Chief Complaint  Patient presents with  . Cough  . Back Pain    Joseph Coleman here for URI complaints.  Duration: 3 days  Associated symptoms: sore throat and cough Denies: sinus congestion, sinus pain, rhinorrhea, itchy watery eyes, ear pain, ear drainage, wheezing, shortness of breath, myalgia and fevers Treatment to date: OTC cough med Sick contacts: Yes  ROS:  Const: Denies fevers HEENT: As noted in HPI Lungs: No SOB  Past Medical History:  Diagnosis Date  . Epigastric pain 2017    BP 110/78 (BP Location: Left Arm, Patient Position: Sitting, Cuff Size: Normal)   Pulse 78   Temp 98.2 F (36.8 C) (Oral)   Ht 5\' 8"  (1.727 m)   Wt 155 lb 2 oz (70.4 kg)   SpO2 99%   BMI 23.59 kg/m  General: Awake, alert, appears stated age HEENT: AT, [place], ears patent b/l and TM's neg, nares patent w/o discharge, pharynx pink and without exudates, MMM Neck: No masses or asymmetry Heart: RRR Lungs: CTAB, no accessory muscle use Psych: Age appropriate judgment and insight, normal mood and affect  Viral URI with cough - Plan: promethazine-dextromethorphan (PROMETHAZINE-DM) 6.25-15 MG/5ML syrup, benzonatate (TESSALON) 200 MG capsule  Orders as above. Continue to push fluids, practice good hand hygiene, cover mouth when coughing. F/u prn. If starting to experience fevers, shaking, or shortness of breath, seek immediate care. Pt voiced understanding and agreement to the plan.  [place], DO 04/26/18 10:51 AM

## 2018-05-05 ENCOUNTER — Ambulatory Visit: Payer: Self-pay

## 2018-05-05 NOTE — Telephone Encounter (Signed)
Ret'd call to pt.  Reported his cough continues x 2 weeks.  Coughing up green phlegm at intervals.  Stated recent low grade fever of 99.5.  Denied shortness of breath, wheezing, or chest tightness.  Stated he doesn't have much nasal congestion.  Denied sinus congestion.  Reported low back pain; associated the back pain with continued coughing.  Reported he has been following recommendations and taking medications per Dr. Nani Ravens, from office eval. On 11/27.  Stated he is only slightly better.  Voiced concern of the green phlegm, and of needing to get healthy, as his wife is very close to having a C-Section.  Advised no available appts. at PCP office today, and offered to sched. appt. at another [place] office.  Pt. declined being sched. at a different office. Stated "I'll just ride it out."  Offered an appt. On Mon. 12/9; pt. Agreed with 12/9, 4:00 PM appt.  Care advice given per protocol.  Verb. Understanding.           Reason for Disposition . [1] Continuous (nonstop) coughing interferes with work or school AND [2] no improvement using cough treatment per Care Advice  Answer Assessment - Initial Assessment Questions 1. ONSET: "When did the cough begin?"      About 2 weeks  2. SEVERITY: "How bad is the cough today?"      Moderate  3. RESPIRATORY DISTRESS: "Describe your breathing."      Denied any shortness of breath  4. FEVER: "Do you have a fever?" If so, ask: "What is your temperature, how was it measured, and when did it start?"     Low grade fever of 99.5 5. SPUTUM: "Describe the color of your sputum" (clear, white, yellow, green)     Green phlegm  6. HEMOPTYSIS: "Are you coughing up any blood?" If so ask: "How much?" (flecks, streaks, tablespoons, etc.)     Denied  7. CARDIAC HISTORY: "Do you have any history of heart disease?" (e.g., heart attack, congestive heart failure)      Denied  8. LUNG HISTORY: "Do you have any history of lung disease?"  (e.g., pulmonary embolus, asthma,  emphysema)    Denied  9. PE RISK FACTORS: "Do you have a history of blood clots?" (or: recent major surgery, recent prolonged travel, bedridden)     Denied 10. OTHER SYMPTOMS: "Do you have any other symptoms?" (e.g., runny nose, wheezing, chest pain)       Denied nasal congestion, or wheezing ; denied chest pain; c/o low back pain with freq. cough   11. PREGNANCY: "Is there any chance you are pregnant?" "When was your last menstrual period?"       n/a 12. TRAVEL: "Have you traveled out of the country in the last month?" (e.g., travel history, exposures)       N/a  Protocols used: [place]

## 2018-05-08 ENCOUNTER — Ambulatory Visit: Payer: BLUE CROSS/BLUE SHIELD | Admitting: Medical

## 2018-05-11 ENCOUNTER — Encounter: Payer: Self-pay | Admitting: Medical

## 2018-05-11 ENCOUNTER — Ambulatory Visit: Payer: BLUE CROSS/BLUE SHIELD | Admitting: Medical

## 2018-05-11 ENCOUNTER — Ambulatory Visit (HOSPITAL_BASED_OUTPATIENT_CLINIC_OR_DEPARTMENT_OTHER)
Admission: RE | Admit: 2018-05-11 | Discharge: 2018-05-11 | Disposition: A | Discharge status: Home / Self Care | Payer: BLUE CROSS/BLUE SHIELD | Source: Ambulatory Visit | Attending: Medical | Admitting: Medical

## 2018-05-11 VITALS — BP 124/67 | HR 83 | Temp 97.6°F | Resp 16 | Ht 68.0 in | Wt 156.4 lb

## 2018-05-11 DIAGNOSIS — M25551 Pain in right hip: Secondary | ICD-10-CM

## 2018-05-11 MED ORDER — DICLOFENAC SODIUM 75 MG PO TBEC: 75.0000 mg | DELAYED_RELEASE_TABLET | Freq: Two times a day (BID) | ORAL | 0 refills | Status: DC

## 2018-05-11 NOTE — Patient Instructions (Signed)
You do have some right hip region pain over the last 10 to 14 days approximate.  Along with this you also appear to have hamstring pain on exam.  Will continue to  rest this area and not do any lower body exercises.  You can use ibuprofen 600 to 800 mg every 8 hours for pain.  If this is not adequate that I did make diclofenac NSAID available to use.  However you cannot use both at the same time.  Would recommend that you put warm compress underneath your right thigh/hamstring area twice a day for 10 to 15 minutes.  This might help relax/heal potential pulled muscle.   Please get x-ray of right hip today.  If pain in the area persists another 7 days then would refer to sports medicine.  Follow-up in 7 days or as needed.

## 2018-05-11 NOTE — Progress Notes (Signed)
Subjective:    Patient ID: Joseph Coleman, male    DOB: [birth year], 37 y.o.   MRN: [id number]  HPI  Pt in states 3 weeks ago his lower back went out/started to hurt. He states he helped neighbor move and then went to work out. Pt states for about 4 days he states could hardly move. During that time he noted his rt hip area felt like it popped when he was lying on his side trying to find a comfortable position for back. Back pain got better about 2.5 weeks ago. But his rt hip pain started as back pain resolved. He speculates that he did not feel hip pain early on with pop since his back was so painful.  Now pain in rt hip hurts worse.   Pt has seen chiropracter about 10 days ago. He went 2 times. Pain is still persisting.  Pt has been taking ibuprofen 200 mg one in am and other in pm.     Review of Systems  Constitutional: Negative for chills, fatigue and fever.  Respiratory: Negative for cough, chest tightness, shortness of breath and wheezing.   Cardiovascular: Negative for chest pain and palpitations.  Gastrointestinal: Negative for abdominal pain.  Genitourinary: Negative for difficulty urinating, enuresis, flank pain and hematuria.  Musculoskeletal: Negative for back pain.       Rt hip pain.  Skin: Negative for rash.  Neurological: Negative for dizziness and light-headedness.  Hematological: Negative for adenopathy.  Psychiatric/Behavioral: Negative for behavioral problems.    Past Medical History:  Diagnosis Date  . Epigastric pain 2017     Social History   Socioeconomic History  . Marital status: Married    Spouse name: Not on file  . Number of children: 2  . Years of education: Not on file  . Highest education level: Not on file  Occupational History  . Not on file  Social Needs  . Financial resource strain: Not on file  . Food insecurity:    Worry: Not on file    Inability: Not on file  . Transportation needs:    Medical: Not on file    Non-medical: Not  on file  Tobacco Use  . Smoking status: Never Smoker  . Smokeless tobacco: Never Used  Substance and Sexual Activity  . Alcohol use: No    Alcohol/week: 0.0 standard drinks  . Drug use: No  . Sexual activity: Yes    Partners: Female  Lifestyle  . Physical activity:    Days per week: Not on file    Minutes per session: Not on file  . Stress: Not on file  Relationships  . Social connections:    Talks on phone: Not on file    Gets together: Not on file    Attends religious service: Not on file    Active member of club or organization: Not on file    Attends meetings of clubs or organizations: Not on file    Relationship status: Not on file  . Intimate partner violence:    Fear of current or ex partner: Not on file    Emotionally abused: Not on file    Physically abused: Not on file    Forced sexual activity: Not on file  Other Topics Concern  . Not on file  Social History Narrative  . Not on file    Past Surgical History:  Procedure Laterality Date  . lipoma removal    . WISDOM TOOTH EXTRACTION  2015    Family  History  Problem Relation Age of Onset  . Hyperlipidemia Mother   . Hypertension Mother   . Diabetes Father   . Alcohol abuse Sister   . Stroke Maternal Grandfather   . Heart attack Paternal Aunt   . Cerebral palsy Son 8  . Premature birth Son 65  . Healthy Daughter 2  . Cancer Neg Hx     No Known Allergies  No current outpatient medications on file prior to visit.   No current facility-administered medications on file prior to visit.     BP 124/67   Pulse 83   Temp 97.6 F (36.4 C) (Oral)   Resp 16   Ht 5\' 8"  (1.727 m)   Wt 156 lb 6.4 oz (70.9 kg)   SpO2 100%   BMI 23.78 kg/m       Objective:   Physical Exam  General Appearance- Not in acute distress.    Chest and Lung Exam Auscultation: Breath sounds:-Normal. Clear even and unlabored. Adventitious sounds:- No Adventitious sounds.  Cardiovascular Auscultation:Rythm - Regular,  rate and rythm. Heart Sounds -Normal heart sounds.  Abdomen Inspection:-Inspection Normal.  Palpation/Perucssion: Palpation and Percussion of the abdomen reveal- Non Tender, No Rebound tenderness, No rigidity(Guarding) and No Palpable abdominal masses.  Liver:-Normal.  Spleen:- Normal.   Back No Mid lumbar spine tenderness to palpation. No Pain on straight leg lift. Pain on lateral movements and flexion/extension of the spine.  Lower ext neurologic  L5-S1 sensation intact bilaterally. Normal patellar reflexes bilaterally. No foot drop bilaterally.  Rt hip- mild pain on range of motion. Also on range of motion rt hamstring painful.      Assessment & Plan:  You do have some right hip region pain over the last 10 to 14 days approximate.  Along with this you also appear to have hamstring pain on exam.  Will continue to  rest this area and not do any lower body exercises.  You can use ibuprofen 600 to 800 mg every 8 hours for pain.  If this is not adequate that I did make diclofenac NSAID available to use.  However you cannot use both at the same time.  Would recommend that you put warm compress underneath your right thigh/hamstring area twice a day for 10 to 15 minutes.  This might help relax/heal potential pulled muscle.   Please get x-ray of right hip today.  If pain in the area persists another 7 days then would refer to sports medicine.  Follow-up in 7 days or as needed.  Mackie Pai, PA-C

## 2018-05-29 ENCOUNTER — Telehealth: Payer: Self-pay

## 2018-05-29 DIAGNOSIS — M25551 Pain in right hip: Secondary | ICD-10-CM

## 2018-05-29 NOTE — Telephone Encounter (Signed)
Referral to PT placed

## 2018-05-29 NOTE — Telephone Encounter (Signed)
Copied from [place] [id number]. Topic: Referral - Request for Referral >> May 29, 2018  9:49 AM Ivar Drape wrote: Has patient seen PCP for this complaint?   YES *If NO, is insurance requiring patient see PCP for this issue before PCP can refer them? Referral for which specialty:  Ortho Physical Therapist Preferred provider/office:  Isa Rankin in [place] [phone] Reason for referral:   Hip pain

## 2018-06-10 DIAGNOSIS — M5431 Sciatica, right side: Secondary | ICD-10-CM | POA: Diagnosis not present

## 2018-06-10 DIAGNOSIS — X509XXA Other and unspecified overexertion or strenuous movements or postures, initial encounter: Secondary | ICD-10-CM | POA: Diagnosis not present

## 2018-06-10 DIAGNOSIS — Y999 Unspecified external cause status: Secondary | ICD-10-CM | POA: Diagnosis not present

## 2018-06-10 DIAGNOSIS — M25551 Pain in right hip: Secondary | ICD-10-CM | POA: Diagnosis not present

## 2018-06-12 DIAGNOSIS — M5416 Radiculopathy, lumbar region: Secondary | ICD-10-CM | POA: Diagnosis not present

## 2018-06-12 DIAGNOSIS — M5117 Intervertebral disc disorders with radiculopathy, lumbosacral region: Secondary | ICD-10-CM | POA: Diagnosis not present

## 2018-06-19 DIAGNOSIS — M7061 Trochanteric bursitis, right hip: Secondary | ICD-10-CM | POA: Diagnosis not present

## 2018-06-27 DIAGNOSIS — M546 Pain in thoracic spine: Secondary | ICD-10-CM | POA: Diagnosis not present

## 2018-06-27 DIAGNOSIS — M9902 Segmental and somatic dysfunction of thoracic region: Secondary | ICD-10-CM | POA: Diagnosis not present

## 2018-06-27 DIAGNOSIS — M9903 Segmental and somatic dysfunction of lumbar region: Secondary | ICD-10-CM | POA: Diagnosis not present

## 2018-06-27 DIAGNOSIS — M5441 Lumbago with sciatica, right side: Secondary | ICD-10-CM | POA: Diagnosis not present

## 2018-06-28 DIAGNOSIS — M9902 Segmental and somatic dysfunction of thoracic region: Secondary | ICD-10-CM | POA: Diagnosis not present

## 2018-06-28 DIAGNOSIS — M5441 Lumbago with sciatica, right side: Secondary | ICD-10-CM | POA: Diagnosis not present

## 2018-06-28 DIAGNOSIS — M546 Pain in thoracic spine: Secondary | ICD-10-CM | POA: Diagnosis not present

## 2018-06-28 DIAGNOSIS — M9903 Segmental and somatic dysfunction of lumbar region: Secondary | ICD-10-CM | POA: Diagnosis not present

## 2018-06-30 DIAGNOSIS — M9902 Segmental and somatic dysfunction of thoracic region: Secondary | ICD-10-CM | POA: Diagnosis not present

## 2018-06-30 DIAGNOSIS — M546 Pain in thoracic spine: Secondary | ICD-10-CM | POA: Diagnosis not present

## 2018-06-30 DIAGNOSIS — M9903 Segmental and somatic dysfunction of lumbar region: Secondary | ICD-10-CM | POA: Diagnosis not present

## 2018-06-30 DIAGNOSIS — M5441 Lumbago with sciatica, right side: Secondary | ICD-10-CM | POA: Diagnosis not present

## 2018-07-03 DIAGNOSIS — M5416 Radiculopathy, lumbar region: Secondary | ICD-10-CM | POA: Diagnosis not present

## 2018-07-17 DIAGNOSIS — M5416 Radiculopathy, lumbar region: Secondary | ICD-10-CM | POA: Diagnosis not present

## 2018-07-17 DIAGNOSIS — M5126 Other intervertebral disc displacement, lumbar region: Secondary | ICD-10-CM | POA: Diagnosis not present

## 2018-07-19 DIAGNOSIS — M5441 Lumbago with sciatica, right side: Secondary | ICD-10-CM | POA: Diagnosis not present

## 2018-07-19 DIAGNOSIS — M9902 Segmental and somatic dysfunction of thoracic region: Secondary | ICD-10-CM | POA: Diagnosis not present

## 2018-07-19 DIAGNOSIS — M9903 Segmental and somatic dysfunction of lumbar region: Secondary | ICD-10-CM | POA: Diagnosis not present

## 2018-07-19 DIAGNOSIS — M546 Pain in thoracic spine: Secondary | ICD-10-CM | POA: Diagnosis not present

## 2018-07-24 DIAGNOSIS — M545 Low back pain: Secondary | ICD-10-CM | POA: Diagnosis not present

## 2018-07-24 DIAGNOSIS — M7631 Iliotibial band syndrome, right leg: Secondary | ICD-10-CM | POA: Diagnosis not present

## 2018-07-26 DIAGNOSIS — M9903 Segmental and somatic dysfunction of lumbar region: Secondary | ICD-10-CM | POA: Diagnosis not present

## 2018-07-26 DIAGNOSIS — M546 Pain in thoracic spine: Secondary | ICD-10-CM | POA: Diagnosis not present

## 2018-07-26 DIAGNOSIS — M9902 Segmental and somatic dysfunction of thoracic region: Secondary | ICD-10-CM | POA: Diagnosis not present

## 2018-07-26 DIAGNOSIS — M5441 Lumbago with sciatica, right side: Secondary | ICD-10-CM | POA: Diagnosis not present

## 2018-07-28 DIAGNOSIS — S330XXD Traumatic rupture of lumbar intervertebral disc, subsequent encounter: Secondary | ICD-10-CM | POA: Diagnosis not present

## 2018-07-28 DIAGNOSIS — M5416 Radiculopathy, lumbar region: Secondary | ICD-10-CM | POA: Diagnosis not present

## 2018-07-28 DIAGNOSIS — M545 Low back pain: Secondary | ICD-10-CM | POA: Diagnosis not present

## 2018-08-01 ENCOUNTER — Encounter: Payer: Self-pay | Admitting: Medical

## 2018-08-01 ENCOUNTER — Ambulatory Visit (INDEPENDENT_AMBULATORY_CARE_PROVIDER_SITE_OTHER): Payer: BLUE CROSS/BLUE SHIELD | Admitting: Medical

## 2018-08-01 VITALS — BP 118/77 | HR 79 | Temp 98.2°F | Resp 16 | Ht 68.0 in | Wt 151.8 lb

## 2018-08-01 DIAGNOSIS — R05 Cough: Secondary | ICD-10-CM

## 2018-08-01 DIAGNOSIS — J069 Acute upper respiratory infection, unspecified: Secondary | ICD-10-CM | POA: Diagnosis not present

## 2018-08-01 DIAGNOSIS — R059 Cough, unspecified: Secondary | ICD-10-CM

## 2018-08-01 MED ORDER — FLUTICASONE PROPIONATE 50 MCG/ACT NA SUSP: 2.0000 | Freq: Every day | NASAL | 1 refills | Status: DC

## 2018-08-01 MED ORDER — BENZONATATE 100 MG PO CAPS: 100.0000 mg | ORAL_CAPSULE | Freq: Three times a day (TID) | ORAL | 0 refills | Status: DC | PRN

## 2018-08-01 MED ORDER — DOXYCYCLINE HYCLATE 100 MG PO TABS: 100.0000 mg | ORAL_TABLET | Freq: Two times a day (BID) | ORAL | 0 refills | Status: DC

## 2018-08-01 NOTE — Progress Notes (Signed)
Subjective:    Patient ID: Joseph Coleman, male    DOB: [birth year], 38 y.o.   MRN: [id number]  HPI  Pt in with recent some productive cough for past few days/more since last night. Feeling nasal congested. 3 of his children have been sick with similar symptoms. One of his kids was admitted for possible pneumonia. None of his kids had the flu.  No recent travels anywhere.    Review of Systems  Constitutional: Negative for chills, fatigue and fever.  HENT: Positive for congestion. Negative for nosebleeds, sinus pressure, sinus pain, sneezing and sore throat.   Respiratory: Positive for cough. Negative for chest tightness, shortness of breath and wheezing.   Cardiovascular: Negative for chest pain and palpitations.  Gastrointestinal: Negative for abdominal distention, abdominal pain and anal bleeding.  Musculoskeletal: Negative for back pain and myalgias.  Skin: Negative for rash.  Neurological: Negative for dizziness, speech difficulty, weakness and headaches.  Hematological: Negative for adenopathy. Does not bruise/bleed easily.  Psychiatric/Behavioral: Negative for behavioral problems and confusion.   Past Medical History:  Diagnosis Date  . Epigastric pain 2017     Social History   Socioeconomic History  . Marital status: Married    Spouse name: Not on file  . Number of children: 2  . Years of education: Not on file  . Highest education level: Not on file  Occupational History  . Not on file  Social Needs  . Financial resource strain: Not on file  . Food insecurity:    Worry: Not on file    Inability: Not on file  . Transportation needs:    Medical: Not on file    Non-medical: Not on file  Tobacco Use  . Smoking status: Never Smoker  . Smokeless tobacco: Never Used  Substance and Sexual Activity  . Alcohol use: No    Alcohol/week: 0.0 standard drinks  . Drug use: No  . Sexual activity: Yes    Partners: Female  Lifestyle  . Physical activity:    Days per  week: Not on file    Minutes per session: Not on file  . Stress: Not on file  Relationships  . Social connections:    Talks on phone: Not on file    Gets together: Not on file    Attends religious service: Not on file    Active member of club or organization: Not on file    Attends meetings of clubs or organizations: Not on file    Relationship status: Not on file  . Intimate partner violence:    Fear of current or ex partner: Not on file    Emotionally abused: Not on file    Physically abused: Not on file    Forced sexual activity: Not on file  Other Topics Concern  . Not on file  Social History Narrative  . Not on file    Past Surgical History:  Procedure Laterality Date  . lipoma removal    . WISDOM TOOTH EXTRACTION  2015    Family History  Problem Relation Age of Onset  . Hyperlipidemia Mother   . Hypertension Mother   . Diabetes Father   . Alcohol abuse Sister   . Stroke Maternal Grandfather   . Heart attack Paternal Aunt   . Cerebral palsy Son 8  . Premature birth Son 10  . Healthy Daughter 2  . Cancer Neg Hx     Allergies  Allergen Reactions  . Hydrocodone-Homatropine Rash    No current outpatient  medications on file prior to visit.   No current facility-administered medications on file prior to visit.     BP 118/77   Pulse 79   Temp 98.2 F (36.8 C) (Oral)   Resp 16   Ht 5\' 8"  (1.727 m)   Wt 151 lb 12.8 oz (68.9 kg)   SpO2 98%   BMI 23.08 kg/m       Objective:   Physical Exam  General  Mental Status - Alert. General Appearance - Well groomed. Not in acute distress.  Skin Rashes- No Rashes.  HEENT Head- Normal. Ear Auditory Canal - Left- Normal. Right - normalTympanic Membrane- Left- Normal. Right- rt tm mild central pink appearance. Eye Sclera/Conjunctiva- Left- Normal. Right- Normal. Nose & Sinuses Nasal Mucosa- Left-  Boggy and Congested. Right-  Boggy and  Congested.Bilateral no maxillary and no  frontal sinus pressure. Mouth  & Throat Lips: Upper Lip- Normal: no dryness, cracking, pallor, cyanosis, or vesicular eruption. Lower Lip-Normal: no dryness, cracking, pallor, cyanosis or vesicular eruption. Buccal Mucosa- Bilateral- No Aphthous ulcers. Oropharynx- No Discharge or Erythema. Tonsils: Characteristics- Bilateral- No Erythema or Congestion. Size/Enlargement- Bilateral- No enlargement. Discharge- bilateral-None.  Neck Neck- Supple. No Masses.   Chest and Lung Exam Auscultation: Breath Sounds:-Clear even and unlabored.  Cardiovascular Auscultation:Rythm- Regular, rate and rhythm. Murmurs & Other Heart Sounds:Ausculatation of the heart reveal- No Murmurs.  Lymphatic Head & Neck General Head & Neck Lymphatics: Bilateral: Description- No Localized lymphadenopathy.       Assessment & Plan:  You do have very early mostly upper respiratory infection type signs and symptoms.  Although recent illnesses in various family members and 1 who was admitted for possible pneumonia causes her concern.  Recommend proceeding with caution.  Recommend resting adequately and hydrating well.  Prescribing Flonase nasal spray for nasal congestion and benzonatate tablets for cough.  If you find that your symptoms are worsening indicating possible bronchitis, sinus infection, ear infection or severe such as pneumonia like.  Recommend starting doxycycline antibiotic(making antibiotic available as a printed prescription.).  If you do have to start antibiotic and you do not feel like you are progressively improving then let me know.  That event would recommend getting a chest x-ray and labs.  If any severe or worsening signs symptoms after hours and recommend ED evaluation.  Follow-up in 7 to 10 days or as needed.  Mackie Pai, PA-C

## 2018-08-01 NOTE — Patient Instructions (Signed)
You do have very early mostly upper respiratory infection type signs and symptoms.  Although recent illnesses in various family members and 1 who was admitted for possible pneumonia causes her concern.  Recommend proceeding with caution.  Recommend resting adequately and hydrating well.  Prescribing Flonase nasal spray for nasal congestion and benzonatate tablets for cough.  If you find that your symptoms are worsening indicating possible bronchitis, sinus infection, ear infection or severe such as pneumonia like.  Recommend starting doxycycline antibiotic(making antibiotic available as a printed prescription.).  If you do have to start antibiotic and you do not feel like you are progressively improving then let me know.  That event would recommend getting a chest x-ray and labs.  If any severe or worsening signs symptoms after hours and recommend ED evaluation.  Follow-up in 7 to 10 days or as needed.

## 2018-09-01 ENCOUNTER — Other Ambulatory Visit: Payer: Self-pay

## 2018-09-01 ENCOUNTER — Emergency Department (HOSPITAL_BASED_OUTPATIENT_CLINIC_OR_DEPARTMENT_OTHER)
Admission: EM | Admit: 2018-09-01 | Discharge: 2018-09-01 | Disposition: A | Discharge status: Home / Self Care | Payer: BLUE CROSS/BLUE SHIELD | Attending: Emergency Medicine | Admitting: Emergency Medicine

## 2018-09-01 ENCOUNTER — Encounter (HOSPITAL_BASED_OUTPATIENT_CLINIC_OR_DEPARTMENT_OTHER): Payer: Self-pay | Admitting: *Deleted

## 2018-09-01 DIAGNOSIS — Y999 Unspecified external cause status: Secondary | ICD-10-CM | POA: Diagnosis not present

## 2018-09-01 DIAGNOSIS — Y939 Activity, unspecified: Secondary | ICD-10-CM | POA: Diagnosis not present

## 2018-09-01 DIAGNOSIS — Y929 Unspecified place or not applicable: Secondary | ICD-10-CM | POA: Insufficient documentation

## 2018-09-01 DIAGNOSIS — W268XXA Contact with other sharp object(s), not elsewhere classified, initial encounter: Secondary | ICD-10-CM | POA: Insufficient documentation

## 2018-09-01 DIAGNOSIS — Z79899 Other long term (current) drug therapy: Secondary | ICD-10-CM | POA: Insufficient documentation

## 2018-09-01 DIAGNOSIS — S61217A Laceration without foreign body of left little finger without damage to nail, initial encounter: Secondary | ICD-10-CM | POA: Insufficient documentation

## 2018-09-01 DIAGNOSIS — S6992XA Unspecified injury of left wrist, hand and finger(s), initial encounter: Secondary | ICD-10-CM | POA: Diagnosis present

## 2018-09-01 DIAGNOSIS — Z23 Encounter for immunization: Secondary | ICD-10-CM | POA: Insufficient documentation

## 2018-09-01 MED ORDER — LIDOCAINE-EPINEPHRINE (PF) 2 %-1:200000 IJ SOLN
10.0000 mL | Freq: Once | INTRAMUSCULAR | Status: DC
  Filled 2018-09-01 (×2): qty 10

## 2018-09-01 MED ORDER — TETANUS-DIPHTH-ACELL PERTUSSIS 5-2.5-18.5 LF-MCG/0.5 IM SUSP
0.5000 mL | Freq: Once | INTRAMUSCULAR | Status: AC
  Administered 2018-09-01: 0.5 mL via INTRAMUSCULAR
  Filled 2018-09-01: qty 0.5

## 2018-09-01 MED ORDER — LIDOCAINE HCL (PF) 1 % IJ SOLN
INTRAMUSCULAR | Status: AC
  Filled 2018-09-01: qty 5

## 2018-09-01 NOTE — ED Notes (Signed)
ED Provider at bedside. 

## 2018-09-01 NOTE — Discharge Instructions (Addendum)
Suture removal in 10 days

## 2018-09-01 NOTE — ED Triage Notes (Signed)
Laceration to his left little finger on a broken plate. Bleeding controlled. States he needs a DT.

## 2018-09-01 NOTE — ED Provider Notes (Signed)
[place] EMERGENCY DEPARTMENT Provider Note   CSN: [id number] Arrival date & time: 09/01/18  1413    History   Chief Complaint Chief Complaint  Patient presents with  . Laceration    HPI Joseph Coleman is a 38 y.o. male.     HPI Patient is a 38 year old male presents the emergency department with a laceration to his left little finger which he sustained today on a broken dish.  He is unsure of his last tetanus update.  He is able to flex and extend his left little finger.  No active bleeding at this time.  Pain is mild in severity.   Past Medical History:  Diagnosis Date  . Epigastric pain 2017    There are no active problems to display for this patient.   Past Surgical History:  Procedure Laterality Date  . lipoma removal    . WISDOM TOOTH EXTRACTION  2015        Home Medications    Prior to Admission medications   Medication Sig Start Date End Date Taking? Authorizing Provider  benzonatate (TESSALON) 100 MG capsule Take 1 capsule (100 mg total) by mouth 3 (three) times daily as needed for cough. 08/01/18   Saguier, Percell Miller, PA-C  doxycycline (VIBRA-TABS) 100 MG tablet Take 1 tablet (100 mg total) by mouth 2 (two) times daily. Can give caps or generic 08/01/18   Saguier, Percell Miller, PA-C  fluticasone [facility]) 50 MCG/ACT nasal spray Place 2 sprays into both nostrils daily. 08/01/18   Saguier, Percell Miller, PA-C    Family History Family History  Problem Relation Age of Onset  . Hyperlipidemia Mother   . Hypertension Mother   . Diabetes Father   . Alcohol abuse Sister   . Stroke Maternal Grandfather   . Heart attack Paternal Aunt   . Cerebral palsy Son 8  . Premature birth Son 55  . Healthy Daughter 2  . Cancer Neg Hx     Social History Social History   Tobacco Use  . Smoking status: Never Smoker  . Smokeless tobacco: Never Used  Substance Use Topics  . Alcohol use: No    Alcohol/week: 0.0 standard drinks  . Drug use: No     Allergies    Hydrocodone-homatropine   Review of Systems Review of Systems  All other systems reviewed and are negative.    Physical Exam Updated Vital Signs BP (!) 126/98   Pulse 82   Temp 98.2 F (36.8 C) (Oral)   Resp 20   Ht 5\' 8"  (1.727 m)   Wt 68 kg   SpO2 100%   BMI 22.81 kg/m   Physical Exam Vitals signs and nursing note reviewed.  Constitutional:      Appearance: He is well-developed.  HENT:     Head: Normocephalic.  Neck:     Musculoskeletal: Normal range of motion.  Pulmonary:     Effort: Pulmonary effort is normal.  Abdominal:     General: There is no distension.  Musculoskeletal: Normal range of motion.     Comments: Laceration left little finger overlying the distal phalanx on the ulnar aspect.  Normal flexion and extension of left little finger.  Normal perfusion to his fingertip.  Neurological:     Mental Status: He is alert and oriented to person, place, and time.      ED Treatments / Results  Labs (all labs ordered are listed, but only abnormal results are displayed) Labs Reviewed - No data to display  EKG None  Radiology No results found.  Procedures .Marland KitchenLaceration Repair Performed by: Jola Schmidt, MD Authorized by: Jola Schmidt, MD   .Nerve Block Performed by: Jola Schmidt, MD Authorized by: Jola Schmidt, MD    LACERATION REPAIR Performed by: Jola Schmidt Consent: Verbal consent obtained. Risks and benefits: risks, benefits and alternatives were discussed Patient identity confirmed: provided demographic data Time out performed prior to procedure Prepped and Draped in normal sterile fashion Wound explored Laceration Location: Left little finger Laceration Length: 1.25cm No Foreign Bodies seen or palpated Anesthesia: nerve block Amount of cleaning: standard Skin closure: 4-0 prolene Number of sutures or staples: 3 Technique: simple interrupted Patient tolerance: Patient tolerated the procedure well with no immediate  complications.   NERVE BLOCK Performed by: Jola Schmidt Consent: Verbal consent obtained. Required items: required blood products, implants, devices, and special equipment available Time out: Immediately prior to procedure a "time out" was called to verify the correct patient, procedure, equipment, support staff and site/side marked as required. Indication: Laceration repair Nerve block body site: Digital nerves left little finger Preparation: Patient was prepped and draped in the usual sterile fashion. Needle gauge: 24 G Location technique: anatomical landmarks Local anesthetic: Lidocaine 1% without Anesthetic total: 5 ml Outcome: pain improved Patient tolerance: Patient tolerated the procedure well with no immediate complications.     Medications Ordered in ED Medications  lidocaine-EPINEPHrine (XYLOCAINE W/EPI) 2 %-1:200000 (PF) injection 10 mL (has no administration in time range)  lidocaine (PF) (XYLOCAINE) 1 % injection (has no administration in time range)  Tdap (BOOSTRIX) injection 0.5 mL (has no administration in time range)     Initial Impression / Assessment and Plan / ED Course  I have reviewed the triage vital signs and the nursing notes.  Pertinent labs & imaging results that were available during my care of the patient were reviewed by me and considered in my medical decision making (see chart for details).        Laceration repaired.  Infection warnings given.  Tolerated the nerve block with good achieved anesthesia.  Tetanus updated.  Suture removal in 10 days  Final Clinical Impressions(s) / ED Diagnoses   Final diagnoses:  Laceration of left little finger without damage to nail, foreign body presence unspecified, initial encounter    ED Discharge Orders    None       Jola Schmidt, MD 09/01/18 1524

## 2019-06-17 DIAGNOSIS — Z20828 Contact with and (suspected) exposure to other viral communicable diseases: Secondary | ICD-10-CM | POA: Diagnosis not present

## 2019-09-24 ENCOUNTER — Other Ambulatory Visit: Payer: Self-pay

## 2019-10-01 ENCOUNTER — Telehealth: Payer: Self-pay | Admitting: Medical

## 2019-10-01 ENCOUNTER — Other Ambulatory Visit: Payer: Self-pay

## 2019-10-01 ENCOUNTER — Ambulatory Visit (INDEPENDENT_AMBULATORY_CARE_PROVIDER_SITE_OTHER): Payer: BC Managed Care – PPO | Admitting: Medical

## 2019-10-01 ENCOUNTER — Encounter: Payer: Self-pay | Admitting: Medical

## 2019-10-01 VITALS — BP 100/70 | HR 63 | Temp 97.8°F | Resp 18 | Ht 68.0 in | Wt 143.2 lb

## 2019-10-01 DIAGNOSIS — Z0000 Encounter for general adult medical examination without abnormal findings: Secondary | ICD-10-CM | POA: Diagnosis not present

## 2019-10-01 DIAGNOSIS — R634 Abnormal weight loss: Secondary | ICD-10-CM | POA: Diagnosis not present

## 2019-10-01 DIAGNOSIS — L309 Dermatitis, unspecified: Secondary | ICD-10-CM

## 2019-10-01 DIAGNOSIS — R7989 Other specified abnormal findings of blood chemistry: Secondary | ICD-10-CM

## 2019-10-01 LAB — TSH: TSH: 0.32 u[IU]/mL — ABNORMAL LOW (ref 0.35–4.50)

## 2019-10-01 MED ORDER — TRIAMCINOLONE ACETONIDE 0.025 % EX OINT: 1.0000 "application " | TOPICAL_OINTMENT | Freq: Two times a day (BID) | CUTANEOUS | 0 refills | Status: DC

## 2019-10-01 NOTE — Progress Notes (Signed)
Subjective:    Patient ID: Joseph Coleman, male    DOB: 1980-08-18, 39 y.o.   MRN: [id number]  HPI   Pt in for cpe/wellness exam.  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 Pt wife just changing jobs.  Pt does exercise regularly.   Pt states tdap done about 5 years ago.  Pt declines flu vaccine.  Pt has lost about 7 lb since he got braces. He states he has been eating better but less since having braces. 2 years ago he weighed 143 lb. He was working out a lot back then. States since covid he stopped working out.  Pt not sure if he will get covid vaccine.      Review of Systems  Constitutional: Negative for chills and fatigue.  HENT: Negative for dental problem.   Respiratory: Negative for cough, chest tightness, shortness of breath and wheezing.   Cardiovascular: Negative for chest pain and palpitations.  Gastrointestinal: Negative for abdominal pain, blood in stool, diarrhea, nausea and vomiting.  Genitourinary: Negative for difficulty urinating and enuresis.  Musculoskeletal: Negative for back pain and joint swelling.  Skin: Negative for rash.       Small hypopigmented are upper back/lower neck.   Neurological: Negative for dizziness, seizures, syncope, weakness, numbness and headaches.  Hematological: Negative for adenopathy. Does not bruise/bleed easily.  Psychiatric/Behavioral: Negative for behavioral problems, decreased concentration and dysphoric mood. The patient is not hyperactive.     Past Medical History:  Diagnosis Date  . Epigastric pain 2017     Social History   Socioeconomic History  . Marital status: Married    Spouse name: Not on file  . Number of children: 2  . Years of education: Not on file  . Highest education level: Not on file  Occupational History  . Not on file  Tobacco Use  . Smoking status: Never Smoker  .  Smokeless tobacco: Never Used  Substance and Sexual Activity  . Alcohol use: No    Alcohol/week: 0.0 standard drinks  . Drug use: No  . Sexual activity: Yes    Partners: Female  Other Topics Concern  . Not on file  Social History Narrative  . Not on file   Social Determinants of Health   Financial Resource Strain:   . Difficulty of Paying Living Expenses:   Food Insecurity:   . Worried About Charity fundraiser in the Last Year:   . Arboriculturist in the Last Year:   Transportation Needs:   . Film/video editor (Medical):   Marland Kitchen Lack of Transportation (Non-Medical):   Physical Activity:   . Days of Exercise per Week:   . Minutes of Exercise per Session:   Stress:   . Feeling of Stress :   Social Connections:   . Frequency of Communication with Friends and Family:   . Frequency of Social Gatherings with Friends and Family:   . Attends Religious Services:   . Active Member of Clubs or Organizations:   . Attends Archivist Meetings:   Marland Kitchen Marital Status:   Intimate Partner Violence:   . Fear of Current or Ex-Partner:   . Emotionally Abused:   Marland Kitchen Physically Abused:   . Sexually Abused:     Past Surgical History:  Procedure Laterality Date  . lipoma removal    .  WISDOM TOOTH EXTRACTION  2015    Family History  Problem Relation Age of Onset  . Hyperlipidemia Mother   . Hypertension Mother   . Diabetes Father   . Alcohol abuse Sister   . Stroke Maternal Grandfather   . Heart attack Paternal Aunt   . Cerebral palsy Son 8  . Premature birth Son 40  . Healthy Daughter 2  . Cancer Neg Hx     Allergies  Allergen Reactions  . Hydrocodone-Homatropine Rash    Current Outpatient Medications on File Prior to Visit  Medication Sig Dispense Refill  . fluticasone (FLONASE) 50 MCG/ACT nasal spray Place 2 sprays into both nostrils daily. (Patient not taking: Reported on 10/01/2019) 16 g 1   No current facility-administered medications on file prior to visit.     BP 100/70 (BP Location: Left Arm, Patient Position: Sitting, Cuff Size: Normal)   Pulse 63   Temp 97.8 F (36.6 C) (Temporal)   Resp 18   Ht 5\' 8"  (1.727 m)   Wt 143 lb 3.2 oz (65 kg)   SpO2 99%   BMI 21.77 kg/m       Objective:   Physical Exam  General Mental Status- Alert. General Appearance- Not in acute distress.   Skin General: Color- Normal Color. Moisture- Normal Moisture.  Back of neck. 1 cm x 0.5 cm slight flaky area with mild hyperpigmented.  Neck Carotid Arteries- Normal color. Moisture- Normal Moisture. No carotid bruits. No JVD.  Chest and Lung Exam Auscultation: Breath Sounds:-Normal.  Cardiovascular Auscultation:Rythm- Regular. Murmurs & Other Heart Sounds:Auscultation of the heart reveals- No Murmurs.  Abdomen Inspection:-Inspeection Normal. Palpation/Percussion:Note:No mass. Palpation and Percussion of the abdomen reveal- Non Tender, Non Distended + BS, no rebound or guarding.   Neurologic Cranial Nerve exam:- CN III-XII intact(No nystagmus), symmetric smile. Strength:- 5/5 equal and symmetric strength both upper and lower extremities.     Assessment & Plan:  For you wellness exam today I have ordered cbc, cmp and lipid panel.  Vaccines up to date. But no covid. Counseled discussed benefits vs risk.  Recommend exercise and healthy diet.  We will let you know lab results as they come in.  Follow up date appointment will be determined after lab review.   Discussed you weight trends in past and your current wt loss. Advised try to eat as you did before and get back to working out some. If unable to gain any or further loss then need to know and possibly do work up.  For rash on back fungal infection vs early vitiligo. Rx ketoconazole and recheck in 3 weeks. If persist then refer to derm.  For skin and weight follow up in 3 weeks.  [id number] charge for additional 10 minutes spent discussing wt loss and skin condition/plan.  Mackie Pai, PA-C

## 2019-10-01 NOTE — Telephone Encounter (Signed)
Future tsh and t4 placed. 

## 2019-10-01 NOTE — Patient Instructions (Addendum)
For you wellness exam today I have ordered cbc, cmp and lipid panel.  Vaccines up to date. But no covid. Counseled discussed benefits vs risk.  Recommend exercise and healthy diet.  We will let you know lab results as they come in.  Follow up date appointment will be determined after lab review.   Discussed you weight trends in past and your current wt loss. Advised try to eat as you did before and get back to working out some. If unable to gain any or further loss then need to know and possibly do work up.  For rash on back fungal infection vs early vitiligo. Rx ketoconazole and recheck in 3 weeks. If persist then refer to derm.  For skin and weight follow up in 3 weeks.   Preventive Care 56-36 Years Old, Male Preventive care refers to lifestyle choices and visits with your health care provider that can promote health and wellness. This includes:  A yearly physical exam. This is also called an annual well check.  Regular dental and eye exams.  Immunizations.  Screening for certain conditions.  Healthy lifestyle choices, such as eating a healthy diet, getting regular exercise, not using drugs or products that contain nicotine and tobacco, and limiting alcohol use. What can I expect for my preventive care visit? Physical exam Your health care provider will check:  Height and weight. These may be used to calculate body mass index (BMI), which is a measurement that tells if you are at a healthy weight.  Heart rate and blood pressure.  Your skin for abnormal spots. Counseling Your health care provider may ask you questions about:  Alcohol, tobacco, and drug use.  Emotional well-being.  Home and relationship well-being.  Sexual activity.  Eating habits.  Work and work Statistician. What immunizations do I need?  Influenza (flu) vaccine  This is recommended every year. Tetanus, diphtheria, and pertussis (Tdap) vaccine  You may need a Td booster every 10  years. Varicella (chickenpox) vaccine  You may need this vaccine if you have not already been vaccinated. Human papillomavirus (HPV) vaccine  If recommended by your health care provider, you may need three doses over 6 months. Measles, mumps, and rubella (MMR) vaccine  You may need at least one dose of MMR. You may also need a second dose. Meningococcal conjugate (MenACWY) vaccine  One dose is recommended if you are 52-50 years old and a Market researcher living in a residence hall, or if you have one of several medical conditions. You may also need additional booster doses. Pneumococcal conjugate (PCV13) vaccine  You may need this if you have certain conditions and were not previously vaccinated. Pneumococcal polysaccharide (PPSV23) vaccine  You may need one or two doses if you smoke cigarettes or if you have certain conditions. Hepatitis A vaccine  You may need this if you have certain conditions or if you travel or work in places where you may be exposed to hepatitis A. Hepatitis B vaccine  You may need this if you have certain conditions or if you travel or work in places where you may be exposed to hepatitis B. Haemophilus influenzae type b (Hib) vaccine  You may need this if you have certain risk factors. You may receive vaccines as individual doses or as more than one vaccine together in one shot (combination vaccines). Talk with your health care provider about the risks and benefits of combination vaccines. What tests do I need? Blood tests  Lipid and cholesterol levels. These  may be checked every 5 years starting at age 6.  Hepatitis C test.  Hepatitis B test. Screening   Diabetes screening. This is done by checking your blood sugar (glucose) after you have not eaten for a while (fasting).  Sexually transmitted disease (STD) testing. Talk with your health care provider about your test results, treatment options, and if necessary, the need for more  tests. Follow these instructions at home: Eating and drinking   Eat a diet that includes fresh fruits and vegetables, whole grains, lean protein, and low-fat dairy products.  Take vitamin and mineral supplements as recommended by your health care provider.  Do not drink alcohol if your health care provider tells you not to drink.  If you drink alcohol: ? Limit how much you have to 0-2 drinks a day. ? Be aware of how much alcohol is in your drink. In the [place] one drink equals one 12 oz bottle of beer (355 mL), one 5 oz glass of wine (148 mL), or one 1 oz glass of hard liquor (44 mL). Lifestyle  Take daily care of your teeth and gums.  Stay active. Exercise for at least 30 minutes on 5 or more days each week.  Do not use any products that contain nicotine or tobacco, such as cigarettes, e-cigarettes, and chewing tobacco. If you need help quitting, ask your health care provider.  If you are sexually active, practice safe sex. Use a condom or other form of protection to prevent STIs (sexually transmitted infections). What's next?  Go to your health care provider once a year for a well check visit.  Ask your health care provider how often you should have your eyes and teeth checked.  Stay up to date on all vaccines. This information is not intended to replace advice given to you by your health care provider. Make sure you discuss any questions you have with your health care provider. Document Revised: 05/11/2018 Document Reviewed: 05/11/2018 Elsevier Patient Education  2020 [employer].

## 2019-10-08 ENCOUNTER — Other Ambulatory Visit (INDEPENDENT_AMBULATORY_CARE_PROVIDER_SITE_OTHER): Payer: BC Managed Care – PPO

## 2019-10-08 ENCOUNTER — Encounter: Payer: Self-pay | Admitting: Medical

## 2019-10-08 ENCOUNTER — Other Ambulatory Visit: Payer: Self-pay

## 2019-10-08 ENCOUNTER — Telehealth: Payer: Self-pay | Admitting: Medical

## 2019-10-08 DIAGNOSIS — R7989 Other specified abnormal findings of blood chemistry: Secondary | ICD-10-CM | POA: Diagnosis not present

## 2019-10-08 DIAGNOSIS — Z0000 Encounter for general adult medical examination without abnormal findings: Secondary | ICD-10-CM

## 2019-10-08 DIAGNOSIS — R946 Abnormal results of thyroid function studies: Secondary | ICD-10-CM | POA: Diagnosis not present

## 2019-10-08 LAB — T4, FREE: Free T4: 1.02 ng/dL (ref 0.60–1.60)

## 2019-10-08 LAB — TSH: TSH: 0.43 u[IU]/mL (ref 0.35–4.50)

## 2019-10-08 NOTE — Telephone Encounter (Signed)
Wellness exam labs lipid panel cmp and cbc.

## 2019-10-09 ENCOUNTER — Telehealth: Payer: Self-pay | Admitting: *Deleted

## 2019-10-09 ENCOUNTER — Other Ambulatory Visit (INDEPENDENT_AMBULATORY_CARE_PROVIDER_SITE_OTHER): Payer: BC Managed Care – PPO

## 2019-10-09 DIAGNOSIS — Z0000 Encounter for general adult medical examination without abnormal findings: Secondary | ICD-10-CM | POA: Diagnosis not present

## 2019-10-09 LAB — COMPREHENSIVE METABOLIC PANEL
ALT: 16 U/L (ref 0–53)
AST: 16 U/L (ref 0–37)
Albumin: 4.8 g/dL (ref 3.5–5.2)
Alkaline Phosphatase: 47 U/L (ref 39–117)
BUN: 14 mg/dL (ref 6–23)
CO2: 28 mEq/L (ref 19–32)
Calcium: 9.6 mg/dL (ref 8.4–10.5)
Chloride: 102 mEq/L (ref 96–112)
Creatinine, Ser: 0.99 mg/dL (ref 0.40–1.50)
GFR: 83.79 mL/min (ref >60.00)
Glucose, Bld: 47 mg/dL — CL (ref 70–99)
Potassium: 4.1 mEq/L (ref 3.5–5.1)
Sodium: 141 mEq/L (ref 135–145)
Total Bilirubin: 0.4 mg/dL (ref 0.2–1.2)
Total Protein: 7.6 g/dL (ref 6.0–8.3)

## 2019-10-09 LAB — LIPID PANEL
Cholesterol: 193 mg/dL (ref 0–200)
HDL: 60.6 mg/dL (ref >39.00)
LDL Cholesterol: 115 mg/dL — ABNORMAL HIGH (ref 0–99)
NonHDL: 132.78
Total CHOL/HDL Ratio: 3
Triglycerides: 90 mg/dL (ref 0.0–149.0)
VLDL: 18 mg/dL (ref 0.0–40.0)

## 2019-10-09 NOTE — Telephone Encounter (Signed)
Advised pt on my chart result note on steps regarding his low sugar after eating.

## 2019-10-09 NOTE — Telephone Encounter (Signed)
Joseph Coleman advised me to call patient to notify him his sugar was low and to see if he was fasting

## 2019-10-09 NOTE — Telephone Encounter (Signed)
CRITICAL VALUE STICKER  CRITICAL VALUE:   Glucose  46  RECEIVER (on-site recipient of call): Cahlil Sattar, [place] NOTIFIED: 10/09/19 @ 10:59am  MESSENGER (representative from lab): [place]   MD NOTIFIED:  Mackie Pai PA-C  TIME OF NOTIFICATION: 10:59am  RESPONSE:

## 2019-10-09 NOTE — Telephone Encounter (Signed)
Patient states he wasn't fasting during lab appointment , he had a [employer] breakfast and patient had a sausage and egg bagel and a fruity drink around 9 am  and had a smoothie around 6 am, and his lab appointment was at 10:30 .

## 2019-11-09 DIAGNOSIS — Z03818 Encounter for observation for suspected exposure to other biological agents ruled out: Secondary | ICD-10-CM | POA: Diagnosis not present

## 2019-11-09 DIAGNOSIS — Z20822 Contact with and (suspected) exposure to covid-19: Secondary | ICD-10-CM | POA: Diagnosis not present

## 2019-12-10 IMAGING — CR DG HIP (WITH OR WITHOUT PELVIS) 2-3V*R*
3 series · 3 of 3 positions shown · non-contrast
Comparison: None.

CLINICAL DATA: Audible "pop" in the RIGHT hip approximately 3 weeks
ago, now with acute onset of RIGHT hip pain. No discrete injury.

EXAM:
DG HIP (WITH OR WITHOUT PELVIS) 2-3V RIGHT

[t pelvis a.p.]
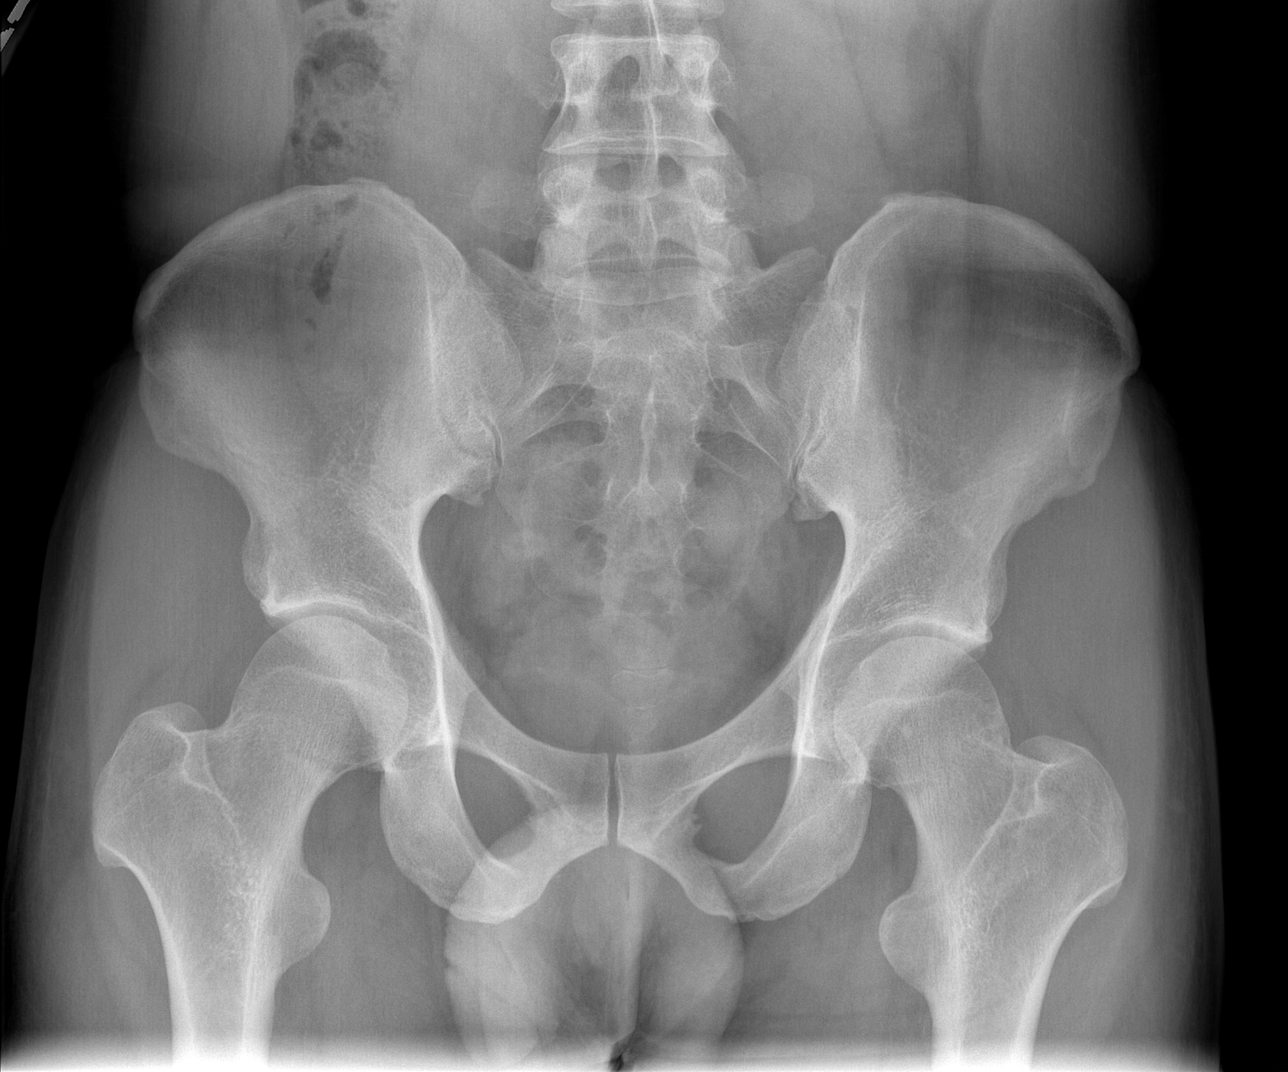

[t hip ap right]
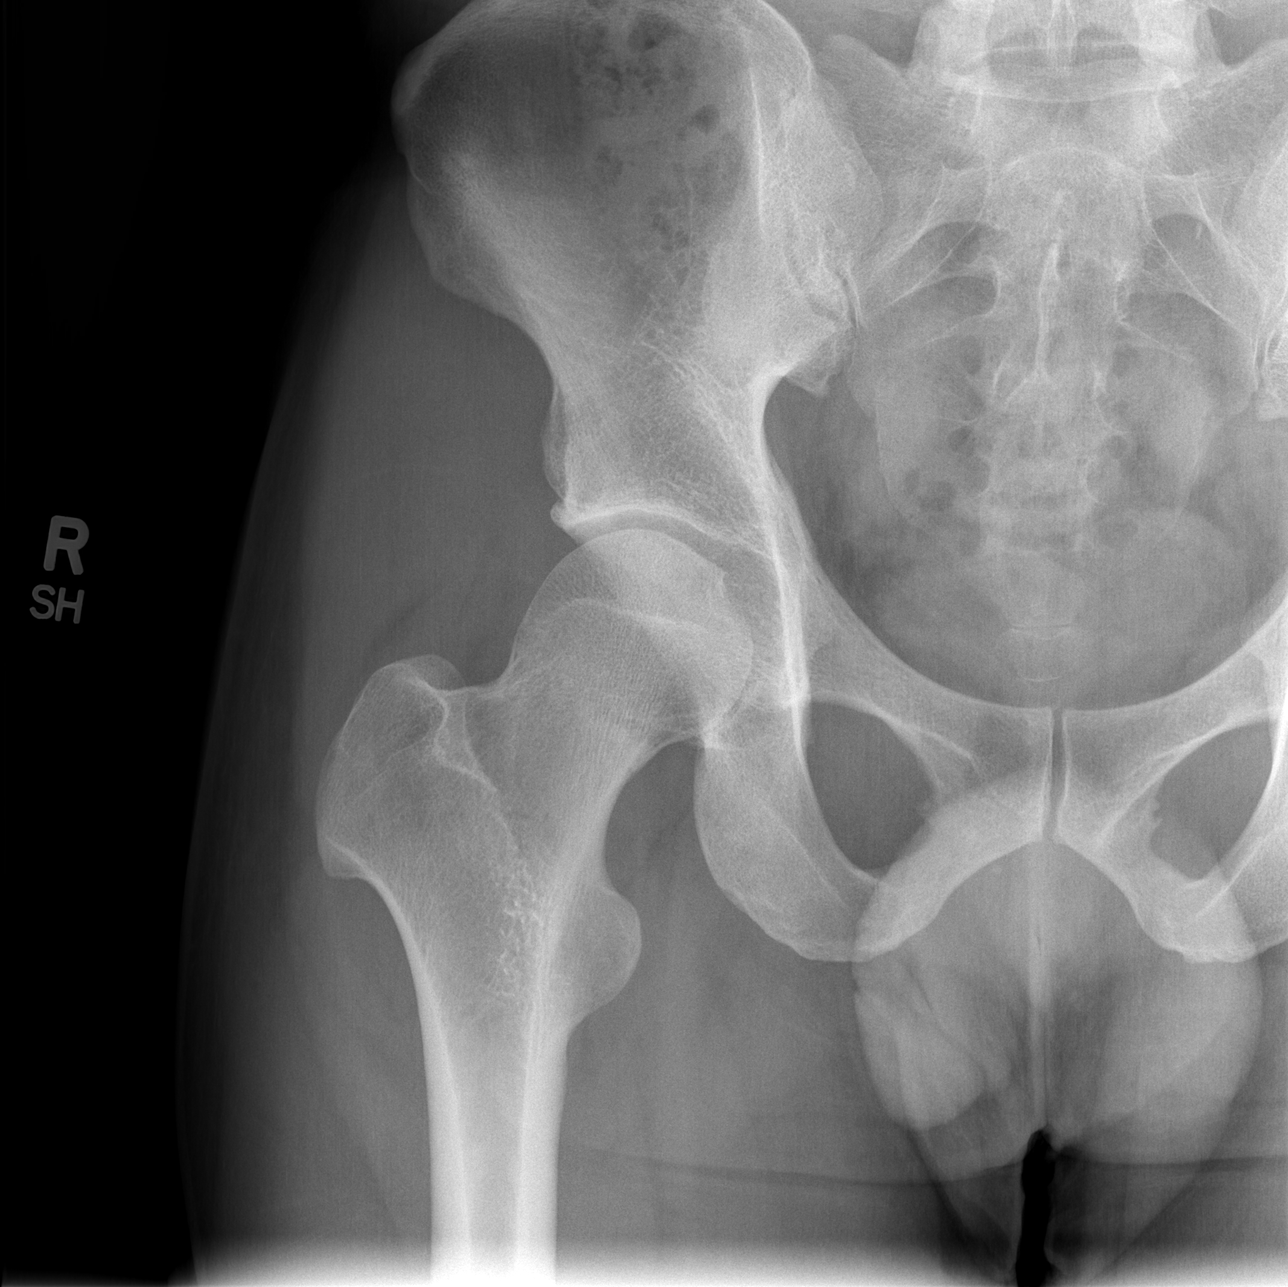

[t hip frog leg right]
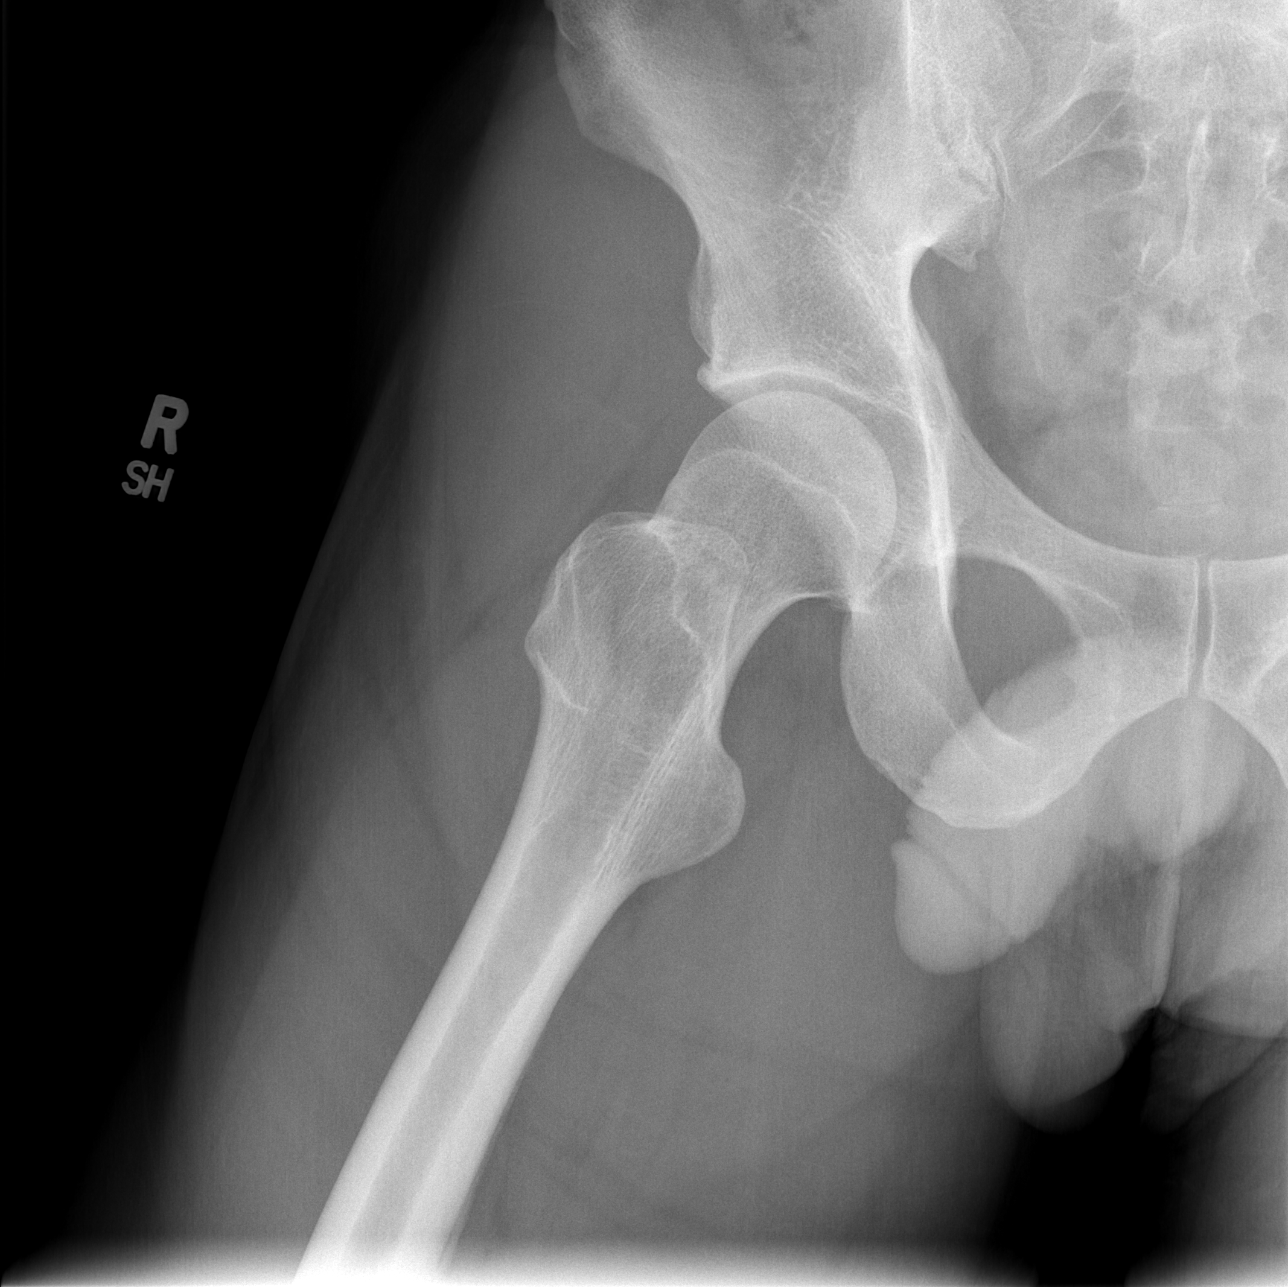

[3 of 3 positions shown; findings below may reference images not displayed]

FINDINGS: No evidence of acute fracture or dislocation. Well preserved joint
space. Well preserved bone mineral density. No intrinsic osseous
abnormality.

Included AP pelvis demonstrates a normal-appearing contralateral
LEFT hip. Sacroiliac joints and symphysis pubis intact. Visualized
lower lumbar spine unremarkable.
IMPRESSION: Normal examination.

## 2020-02-22 DIAGNOSIS — Z20828 Contact with and (suspected) exposure to other viral communicable diseases: Secondary | ICD-10-CM | POA: Diagnosis not present

## 2020-03-07 DIAGNOSIS — Z20822 Contact with and (suspected) exposure to covid-19: Secondary | ICD-10-CM | POA: Diagnosis not present

## 2020-03-08 ENCOUNTER — Emergency Department (HOSPITAL_BASED_OUTPATIENT_CLINIC_OR_DEPARTMENT_OTHER): Payer: BC Managed Care – PPO

## 2020-03-08 ENCOUNTER — Other Ambulatory Visit: Payer: Self-pay

## 2020-03-08 ENCOUNTER — Emergency Department (HOSPITAL_BASED_OUTPATIENT_CLINIC_OR_DEPARTMENT_OTHER)
Admission: EM | Admit: 2020-03-08 | Discharge: 2020-03-08 | Disposition: A | Discharge status: Home / Self Care | Payer: BC Managed Care – PPO | Attending: Emergency Medicine | Admitting: Emergency Medicine

## 2020-03-08 ENCOUNTER — Encounter (HOSPITAL_BASED_OUTPATIENT_CLINIC_OR_DEPARTMENT_OTHER): Payer: Self-pay | Admitting: *Deleted

## 2020-03-08 DIAGNOSIS — R509 Fever, unspecified: Secondary | ICD-10-CM | POA: Diagnosis not present

## 2020-03-08 DIAGNOSIS — U071 COVID-19: Secondary | ICD-10-CM | POA: Insufficient documentation

## 2020-03-08 DIAGNOSIS — R059 Cough, unspecified: Secondary | ICD-10-CM | POA: Diagnosis not present

## 2020-03-08 LAB — RESPIRATORY PANEL BY RT PCR (FLU A&B, COVID)
Influenza A by PCR: NEGATIVE
Influenza B by PCR: NEGATIVE
SARS Coronavirus 2 by RT PCR: POSITIVE — AB

## 2020-03-08 LAB — CBG MONITORING, ED: Glucose-Capillary: 83 mg/dL (ref 70–99)

## 2020-03-08 MED ORDER — ACETAMINOPHEN 500 MG PO TABS: 1000.0000 mg | ORAL_TABLET | Freq: Once | ORAL | Status: AC

## 2020-03-08 MED ORDER — ACETAMINOPHEN 500 MG PO TABS
ORAL_TABLET | ORAL | Status: AC
  Administered 2020-03-08: 1000 mg via ORAL
  Filled 2020-03-08: qty 2

## 2020-03-08 NOTE — ED Provider Notes (Signed)
[facility] EMERGENCY DEPARTMENT Provider Note   CSN: [id number] Arrival date & time: 03/08/20  1259     History Chief Complaint  Patient presents with  . Fever    Joseph Coleman is a 39 y.o. male.  39 year old male who presents with cough, fevers, and chills.  3 days ago, patient began getting sick with cough, body aches, chills, and fevers at home for which he has been taking Tylenol and Motrin.  No vomiting or diarrhea.  No significant breathing problems.  He was tested for Covid yesterday but does not have his results yet.  Today he felt like his skin looked mottled on his legs which is what prompted him to come to the ED.  This has since resolved.  He reports that mother-in-law was hospitalized 2 weeks ago for Covid infection.  No complaints of pain.  The history is provided by the patient.  Fever      Past Medical History:  Diagnosis Date  . Epigastric pain 2017    There are no problems to display for this patient.   Past Surgical History:  Procedure Laterality Date  . lipoma removal    . WISDOM TOOTH EXTRACTION  2015       Family History  Problem Relation Age of Onset  . Hyperlipidemia Mother   . Hypertension Mother   . Diabetes Father   . Alcohol abuse Sister   . Stroke Maternal Grandfather   . Heart attack Paternal Aunt   . Cerebral palsy Son 8  . Premature birth Son 62  . Healthy Daughter 2  . Cancer Neg Hx     Social History   Tobacco Use  . Smoking status: Never Smoker  . Smokeless tobacco: Never Used  Vaping Use  . Vaping Use: Never used  Substance Use Topics  . Alcohol use: No    Alcohol/week: 0.0 standard drinks  . Drug use: No    Home Medications Prior to Admission medications   Medication Sig Start Date End Date Taking? Authorizing Provider  fluticasone (FLONASE) 50 MCG/ACT nasal spray Place 2 sprays into both nostrils daily. Patient not taking: Reported on 10/01/2019 08/01/18   Saguier, Percell Miller, PA-C  triamcinolone  (KENALOG) 0.025 % ointment Apply 1 application topically 2 (two) times daily. 10/01/19   Saguier, Percell Miller, PA-C    Allergies    Hydrocodone-homatropine  Review of Systems   Review of Systems  Constitutional: Positive for fever.   All other systems reviewed and are negative except that which was mentioned in HPI  Physical Exam Updated Vital Signs BP 120/82   Pulse 87   Temp 99.9 F (37.7 C) (Oral)   Resp 12   Ht 5\' 8"  (1.727 m)   Wt 65.8 kg   SpO2 100%   BMI 22.05 kg/m   Physical Exam Vitals and nursing note reviewed.  Constitutional:      General: He is not in acute distress.    Appearance: He is well-developed.  HENT:     Head: Normocephalic and atraumatic.  Eyes:     Conjunctiva/sclera: Conjunctivae normal.  Cardiovascular:     Rate and Rhythm: Normal rate and regular rhythm.     Heart sounds: Normal heart sounds. No murmur heard.   Pulmonary:     Effort: Pulmonary effort is normal.     Breath sounds: Normal breath sounds.  Abdominal:     General: There is no distension.     Palpations: Abdomen is soft.     Tenderness: There  is no abdominal tenderness.  Musculoskeletal:     Cervical back: Neck supple.     Right lower leg: No edema.     Left lower leg: No edema.  Skin:    General: Skin is warm and dry.     Findings: No bruising or erythema.     Comments: No mottling or cyanosis of legs  Neurological:     Mental Status: He is alert and oriented to person, place, and time.     Comments: Fluent speech  Psychiatric:        Judgment: Judgment normal.     ED Results / Procedures / Treatments   Labs (all labs ordered are listed, but only abnormal results are displayed) Labs Reviewed  RESPIRATORY PANEL BY RT PCR (FLU A&B, COVID) - Abnormal; Notable for the following components:      Result Value   SARS Coronavirus 2 by RT PCR POSITIVE (*)    All other components within normal limits  CBG MONITORING, ED    EKG None  Radiology DG Chest Port 1  View  Result Date: 03/08/2020 CLINICAL DATA:  Cough with fever. Clinical concern for COVID-19 infection. EXAM: PORTABLE CHEST 1 VIEW COMPARISON:  Radiographs 08/08/2014. FINDINGS: 1341 hours. The heart size and mediastinal contours are normal. The lungs are clear. There is no pleural effusion or pneumothorax. No acute osseous findings are identified. Telemetry leads overlie the chest. IMPRESSION: Stable chest. No radiographic evidence of active cardiopulmonary process. Electronically Signed   By: Richardean Sale M.D.   On: 03/08/2020 14:25    Procedures Procedures (including critical care time)  Medications Ordered in ED Medications - No data to display  ED Course  I have reviewed the triage vital signs and the nursing notes.  Pertinent labs & imaging results that were available during my care of the patient were reviewed by me and considered in my medical decision making (see chart for details).    MDM Rules/Calculators/A&P                          Well-appearing on exam, 100% on room air, normal work of breathing.  Chest x-ray clear.  COVID-19 positive.  Counseled the patient on the need for quarantine for 10 days from start of illness and need for other family members to have outpatient testing.  Discussed supportive measures including good hydration, Tylenol/Motrin as needed.  Given his age and no other medical problems, he does not qualify for monoclonal antibody infusion.  I have extensively reviewed return precautions with him including breathing problems or concerns for dehydration.  He voiced understanding.  [name] was evaluated in Emergency Department on 03/08/2020 for the symptoms described in the history of present illness. He was evaluated in the context of the global COVID-19 pandemic, which necessitated consideration that the patient might be at risk for infection with the SARS-CoV-2 virus that causes COVID-19. Institutional protocols and algorithms that pertain to the  evaluation of patients at risk for COVID-19 are in a state of rapid change based on information released by regulatory bodies including the CDC and federal and state organizations. These policies and algorithms were followed during the patient's care in the ED.  Final Clinical Impression(s) / ED Diagnoses Final diagnoses:  COVID-19 virus infection    Rx / DC Orders ED Discharge Orders    None       Doranne Schmutz, Wenda Overland, MD 03/08/20 1451

## 2020-03-08 NOTE — ED Notes (Signed)
Pt on monitor 

## 2020-03-08 NOTE — ED Triage Notes (Signed)
Pt reports cough, fatigue, fever since Wednesday. States he was tested yesterday at [facility] for Covid but has not received results yet.  Reports his children have had hand foot and mouth disease this month and his mother-in-law was admitted 2 weeks ago for Covid. Reports his legs were mottled earlier today which concerned his. Last tylenol was around 6am today

## 2020-03-08 NOTE — ED Notes (Signed)
Dr. Rex Kras ED Provider at bedside

## 2020-03-09 ENCOUNTER — Encounter: Payer: Self-pay | Admitting: Physician Assistant

## 2020-03-09 ENCOUNTER — Other Ambulatory Visit (HOSPITAL_COMMUNITY): Payer: Self-pay | Admitting: Oncology

## 2020-03-09 ENCOUNTER — Encounter: Payer: Self-pay | Admitting: Oncology

## 2020-03-09 DIAGNOSIS — U071 COVID-19: Secondary | ICD-10-CM

## 2020-03-09 NOTE — Progress Notes (Signed)
I connected by phone with  Joseph Coleman to discuss the potential use of an new treatment for mild to moderate COVID-19 viral infection in non-hospitalized patients.   This patient is a age/sex that meets the FDA criteria for Emergency Use Authorization of casirivimab\imdevimab.  Has a (+) direct SARS-CoV-2 viral test result 1. Has mild or moderate COVID-19  2. Is ? 40 years of age and weighs ? 40 kg 3. Is NOT hospitalized due to COVID-19 4. Is NOT requiring oxygen therapy or requiring an increase in baseline oxygen flow rate due to COVID-19 5. Is within 10 days of symptom onset 6. Has at least one of the high risk factor(s) for progression to severe COVID-19 and/or hospitalization as defined in EUA. ? Specific high risk criteria :   Symptom onset  03/05/2020   I have spoken and communicated the following to the patient or parent/caregiver:   1. FDA has authorized the emergency use of casirivimab\imdevimab for the treatment of mild to moderate COVID-19 in adults and pediatric patients with positive results of direct SARS-CoV-2 viral testing who are 60 years of age and older weighing at least 40 kg, and who are at high risk for progressing to severe COVID-19 and/or hospitalization.   2. The significant known and potential risks and benefits of casirivimab\imdevimab, and the extent to which such potential risks and benefits are unknown.   3. Information on available alternative treatments and the risks and benefits of those alternatives, including clinical trials.   4. Patients treated with casirivimab\imdevimab should continue to self-isolate and use infection control measures (e.g., wear mask, isolate, social distance, avoid sharing personal items, clean and disinfect "high touch" surfaces, and frequent handwashing) according to CDC guidelines.    5. The patient or parent/caregiver has the option to accept or refuse casirivimab\imdevimab .   After reviewing this information with the  patient, The patient agreed to proceed with receiving casirivimab\imdevimab infusion and will be provided a copy of the Fact sheet prior to receiving the infusion.Rulon Abide, AGNP-C [id number] ([place])

## 2020-03-10 ENCOUNTER — Ambulatory Visit (HOSPITAL_COMMUNITY)
Admission: RE | Admit: 2020-03-10 | Discharge: 2020-03-10 | Disposition: A | Discharge status: Home / Self Care | Payer: BC Managed Care – PPO | Source: Ambulatory Visit | Attending: Pulmonary Disease | Admitting: Pulmonary Disease

## 2020-03-10 DIAGNOSIS — U071 COVID-19: Secondary | ICD-10-CM

## 2020-03-10 MED ORDER — SODIUM CHLORIDE 0.9 % IV SOLN: INTRAVENOUS | Status: DC | PRN

## 2020-03-10 MED ORDER — DIPHENHYDRAMINE HCL 50 MG/ML IJ SOLN: 50.0000 mg | Freq: Once | INTRAMUSCULAR | Status: DC | PRN

## 2020-03-10 MED ORDER — FAMOTIDINE IN NACL 20-0.9 MG/50ML-% IV SOLN: 20.0000 mg | Freq: Once | INTRAVENOUS | Status: DC | PRN

## 2020-03-10 MED ORDER — EPINEPHRINE 0.3 MG/0.3ML IJ SOAJ: 0.3000 mg | Freq: Once | INTRAMUSCULAR | Status: DC | PRN

## 2020-03-10 MED ORDER — ALBUTEROL SULFATE HFA 108 (90 BASE) MCG/ACT IN AERS: 2.0000 | INHALATION_SPRAY | Freq: Once | RESPIRATORY_TRACT | Status: DC | PRN

## 2020-03-10 MED ORDER — METHYLPREDNISOLONE SODIUM SUCC 125 MG IJ SOLR: 125.0000 mg | Freq: Once | INTRAMUSCULAR | Status: DC | PRN

## 2020-03-10 MED ORDER — SODIUM CHLORIDE 0.9 % IV SOLN: Freq: Once | INTRAVENOUS | Status: AC

## 2020-03-10 NOTE — Progress Notes (Signed)
  Diagnosis: COVID-19  Physician:Dr Joya Gaskins  Procedure: Covid Infusion Clinic Med: bamlanivimab\etesevimab infusion - Provided patient with bamlanimivab\etesevimab fact sheet for patients, parents and caregivers prior to infusion.  Complications: No immediate complications noted.  Discharge: Discharged home   [place], [place] 03/10/2020

## 2020-03-10 NOTE — Discharge Instructions (Signed)

## 2020-03-13 ENCOUNTER — Telehealth: Payer: Self-pay | Admitting: Medical

## 2020-03-13 NOTE — Telephone Encounter (Signed)
Pt has covid recently. Dx just recently on the 9th oct. I have not seen him for his covid infection. They were given bit misleading information. I can fill out the form but first thing is for him to get scheuled for virtual visit early next week. Need him to give vitals and o2 sat on the virtual visit   So please get him schedule. Will need 40 minutes. As I may need to advise treatment etc. Then sounds like they expect me to fill out paperwork.

## 2020-03-14 NOTE — Telephone Encounter (Signed)
Appt made

## 2020-03-14 NOTE — Telephone Encounter (Signed)
Called pt and lvm to return call.  

## 2020-03-15 ENCOUNTER — Other Ambulatory Visit: Payer: Self-pay

## 2020-03-15 ENCOUNTER — Emergency Department (HOSPITAL_BASED_OUTPATIENT_CLINIC_OR_DEPARTMENT_OTHER)
Admission: EM | Admit: 2020-03-15 | Discharge: 2020-03-15 | Disposition: A | Discharge status: Home / Self Care | Payer: BC Managed Care – PPO | Attending: Emergency Medicine | Admitting: Emergency Medicine

## 2020-03-15 ENCOUNTER — Encounter (HOSPITAL_BASED_OUTPATIENT_CLINIC_OR_DEPARTMENT_OTHER): Payer: Self-pay | Admitting: Emergency Medicine

## 2020-03-15 ENCOUNTER — Emergency Department (HOSPITAL_BASED_OUTPATIENT_CLINIC_OR_DEPARTMENT_OTHER): Payer: BC Managed Care – PPO

## 2020-03-15 DIAGNOSIS — U071 COVID-19: Secondary | ICD-10-CM | POA: Insufficient documentation

## 2020-03-15 DIAGNOSIS — R42 Dizziness and giddiness: Secondary | ICD-10-CM | POA: Diagnosis not present

## 2020-03-15 DIAGNOSIS — R0602 Shortness of breath: Secondary | ICD-10-CM | POA: Diagnosis not present

## 2020-03-15 DIAGNOSIS — R82998 Other abnormal findings in urine: Secondary | ICD-10-CM | POA: Insufficient documentation

## 2020-03-15 DIAGNOSIS — J189 Pneumonia, unspecified organism: Secondary | ICD-10-CM | POA: Diagnosis not present

## 2020-03-15 DIAGNOSIS — R042 Hemoptysis: Secondary | ICD-10-CM | POA: Diagnosis not present

## 2020-03-15 LAB — BASIC METABOLIC PANEL
Anion gap: 10 (ref 5–15)
BUN: 11 mg/dL (ref 6–20)
CO2: 26 mmol/L (ref 22–32)
Calcium: 8.7 mg/dL — ABNORMAL LOW (ref 8.9–10.3)
Chloride: 100 mmol/L (ref 98–111)
Creatinine, Ser: 0.96 mg/dL (ref 0.61–1.24)
GFR, Estimated: >60 mL/min (ref >60)
Glucose, Bld: 96 mg/dL (ref 70–99)
Potassium: 4 mmol/L (ref 3.5–5.1)
Sodium: 136 mmol/L (ref 135–145)

## 2020-03-15 LAB — URINALYSIS, ROUTINE W REFLEX MICROSCOPIC
Bilirubin Urine: NEGATIVE
Glucose, UA: NEGATIVE mg/dL
Hgb urine dipstick: NEGATIVE
Ketones, ur: 15 mg/dL — AB
Leukocytes,Ua: NEGATIVE
Nitrite: NEGATIVE
Protein, ur: NEGATIVE mg/dL
Specific Gravity, Urine: 1.01 (ref 1.005–1.030)
pH: 8.5 — ABNORMAL HIGH (ref 5.0–8.0)

## 2020-03-15 LAB — HEPATIC FUNCTION PANEL
ALT: 32 U/L (ref 0–44)
AST: 27 U/L (ref 15–41)
Albumin: 3.8 g/dL (ref 3.5–5.0)
Alkaline Phosphatase: 50 U/L (ref 38–126)
Bilirubin, Direct: <0.1 mg/dL (ref 0.0–0.2)
Total Bilirubin: 0.6 mg/dL (ref 0.3–1.2)
Total Protein: 7.3 g/dL (ref 6.5–8.1)

## 2020-03-15 LAB — CBC WITH DIFFERENTIAL/PLATELET
Abs Immature Granulocytes: 0.03 10*3/uL (ref 0.00–0.07)
Basophils Absolute: 0 10*3/uL (ref 0.0–0.1)
Basophils Relative: 0 %
Eosinophils Absolute: 0.1 10*3/uL (ref 0.0–0.5)
Eosinophils Relative: 1 %
HCT: 39.9 % (ref 39.0–52.0)
Hemoglobin: 12.4 g/dL — ABNORMAL LOW (ref 13.0–17.0)
Immature Granulocytes: 0 %
Lymphocytes Relative: 18 %
Lymphs Abs: 1.4 10*3/uL (ref 0.7–4.0)
MCH: 20.6 pg — ABNORMAL LOW (ref 26.0–34.0)
MCHC: 31.1 g/dL (ref 30.0–36.0)
MCV: 66.4 fL — ABNORMAL LOW (ref 80.0–100.0)
Monocytes Absolute: 1 10*3/uL (ref 0.1–1.0)
Monocytes Relative: 13 %
Neutro Abs: 5.4 10*3/uL (ref 1.7–7.7)
Neutrophils Relative %: 68 %
Platelets: 283 10*3/uL (ref 150–400)
RBC: 6.01 MIL/uL — ABNORMAL HIGH (ref 4.22–5.81)
RDW: 14.4 % (ref 11.5–15.5)
Smear Review: NORMAL
WBC: 8 10*3/uL (ref 4.0–10.5)
nRBC: 0 % (ref 0.0–0.2)

## 2020-03-15 MED ORDER — SODIUM CHLORIDE 0.9 % IV BOLUS
500.0000 mL | Freq: Once | INTRAVENOUS | Status: AC
  Administered 2020-03-15: 500 mL via INTRAVENOUS

## 2020-03-15 NOTE — ED Provider Notes (Signed)
[facility] EMERGENCY DEPARTMENT Provider Note   CSN: [id number] Arrival date & time: 03/15/20  1003     History Chief Complaint  Patient presents with  . Hemoptysis  . Dark urine    Joseph Coleman is a 39 y.o. male.  The history is provided by the patient.  Cough Cough characteristics:  Non-productive Sputum characteristics:  Bloody Severity:  Mild Onset quality:  Gradual Timing:  Intermittent Progression:  Waxing and waning Chronicity:  New Context: upper respiratory infection (covid)   Relieved by:  Nothing Worsened by:  Nothing Associated symptoms: myalgias   Associated symptoms: no chest pain, no chills, no diaphoresis, no ear pain, no fever, no rash, no shortness of breath and no sore throat        Past Medical History:  Diagnosis Date  . Epigastric pain 2017    There are no problems to display for this patient.   Past Surgical History:  Procedure Laterality Date  . lipoma removal    . WISDOM TOOTH EXTRACTION  2015       Family History  Problem Relation Age of Onset  . Hyperlipidemia Mother   . Hypertension Mother   . Diabetes Father   . Alcohol abuse Sister   . Stroke Maternal Grandfather   . Heart attack Paternal Aunt   . Cerebral palsy Son 8  . Premature birth Son 46  . Healthy Daughter 2  . Cancer Neg Hx     Social History   Tobacco Use  . Smoking status: Never Smoker  . Smokeless tobacco: Never Used  Vaping Use  . Vaping Use: Never used  Substance Use Topics  . Alcohol use: No    Alcohol/week: 0.0 standard drinks  . Drug use: No    Home Medications Prior to Admission medications   Medication Sig Start Date End Date Taking? Authorizing Provider  fluticasone (FLONASE) 50 MCG/ACT nasal spray Place 2 sprays into both nostrils daily. Patient not taking: Reported on 10/01/2019 08/01/18   Saguier, Percell Miller, PA-C  triamcinolone (KENALOG) 0.025 % ointment Apply 1 application topically 2 (two) times daily. 10/01/19   Saguier,  Percell Miller, PA-C    Allergies    Fish allergy, Food [shellfish allergy], and Hydrocodone-homatropine  Review of Systems   Review of Systems  Constitutional: Negative for chills, diaphoresis and fever.  HENT: Negative for ear pain and sore throat.   Eyes: Negative for pain and visual disturbance.  Respiratory: Positive for cough. Negative for shortness of breath.   Cardiovascular: Negative for chest pain and palpitations.  Gastrointestinal: Negative for abdominal pain and vomiting.  Genitourinary: Negative for decreased urine volume, dysuria, hematuria and urgency.       Dark urine   Musculoskeletal: Positive for myalgias. Negative for arthralgias and back pain.  Skin: Negative for color change and rash.  Neurological: Negative for seizures and syncope.  All other systems reviewed and are negative.   Physical Exam Updated Vital Signs  ED Triage Vitals [03/15/20 1019]  Enc Vitals Group     BP 127/84     Pulse Rate 78     Resp (!) 22     Temp 98.8 F (37.1 C)     Temp Source Oral     SpO2 100 %     Weight      Height      Head Circumference      Peak Flow      Pain Score 0     Pain Loc  Pain Edu?      Excl. in [place]?     Physical Exam Vitals and nursing note reviewed.  Constitutional:      General: He is not in acute distress.    Appearance: He is well-developed. He is not ill-appearing.  HENT:     Head: Normocephalic and atraumatic.     Nose: Nose normal.     Mouth/Throat:     Mouth: Mucous membranes are moist.  Eyes:     Extraocular Movements: Extraocular movements intact.     Conjunctiva/sclera: Conjunctivae normal.     Pupils: Pupils are equal, round, and reactive to light.  Cardiovascular:     Rate and Rhythm: Normal rate and regular rhythm.     Pulses: Normal pulses.     Heart sounds: Normal heart sounds. No murmur heard.   Pulmonary:     Effort: Pulmonary effort is normal. No respiratory distress.     Breath sounds: Normal breath sounds.  Abdominal:      Palpations: Abdomen is soft.     Tenderness: There is no abdominal tenderness.  Musculoskeletal:     Cervical back: Normal range of motion and neck supple.  Skin:    General: Skin is warm and dry.     Capillary Refill: Capillary refill takes less than 2 seconds.  Neurological:     General: No focal deficit present.     Mental Status: He is alert.  Psychiatric:        Mood and Affect: Mood normal.     ED Results / Procedures / Treatments   Labs (all labs ordered are listed, but only abnormal results are displayed) Labs Reviewed  CBC WITH DIFFERENTIAL/PLATELET - Abnormal; Notable for the following components:      Result Value   RBC 6.01 (*)    Hemoglobin 12.4 (*)    MCV 66.4 (*)    MCH 20.6 (*)    All other components within normal limits  BASIC METABOLIC PANEL - Abnormal; Notable for the following components:   Calcium 8.7 (*)    All other components within normal limits  URINALYSIS, ROUTINE W REFLEX MICROSCOPIC - Abnormal; Notable for the following components:   pH 8.5 (*)    Ketones, ur 15 (*)    All other components within normal limits  HEPATIC FUNCTION PANEL    EKG None  Radiology DG Chest Portable 1 View  Result Date: 03/15/2020 CLINICAL DATA:  Diagnosed with COVID pneumonia on 03/08/2020 with persistent cough shortness of breath and dizziness. Now with hemoptysis. EXAM: PORTABLE CHEST 1 VIEW COMPARISON:  03/08/2020 FINDINGS: Grossly unchanged cardiac silhouette and mediastinal contours. Interval development bilateral nodular airspace opacities, right greater than left, worrisome for multifocal pneumonia. No pleural effusion or pneumothorax. No evidence of edema. No acute osseous abnormalities. IMPRESSION: Bilateral nodular airspace opacities, right greater than left, worrisome for multifocal COVID-19 pneumonia. Electronically Signed   By: Sandi Mariscal M.D.   On: 03/15/2020 11:24    Procedures Procedures (including critical care time)  Medications Ordered in  ED Medications  sodium chloride 0.9 % bolus 500 mL (500 mLs Intravenous New Bag/Given 03/15/20 1058)    ED Course  I have reviewed the triage vital signs and the nursing notes.  Pertinent labs & imaging results that were available during my care of the patient were reviewed by me and considered in my medical decision making (see chart for details).    MDM Rules/Calculators/A&P  Joseph Coleman is a 39 year old male who presents the ED with cough, dehydration concern.  Normal vitals.  No fever.  Diagnosed with Covid about a week ago.  Has had some intermittent cough.  Blood intermixed in his sputum but no large-volume hemoptysis.  Not on blood thinners.  Overall appears well.  Normal room air oxygenation and also normal oxygenation with ambulation.  He states that his urine has gotten more dark but he has been taking a lot of vitamins.  Overall we will check basic labs give a small fluid bolus and reevaluate.  Suspect symptoms secondary to Covid and some mild dehydration.  No abdominal pain, no tenderness on exam.  Chest x-ray consistent with Covid pneumonia.  However no significant anemia, electrolyte abnormality, kidney injury.  Urinalysis negative for infection.  Overall recommend continued supportive care at home.  He does have a pulse oximeter at home gave education about that and return precautions.  Discharged in good condition.  This chart was dictated using voice recognition software.  Despite best efforts to proofread,  errors can occur which can change the documentation meaning.  Joseph Coleman was evaluated in Emergency Department on 03/15/2020 for the symptoms described in the history of present illness. He was evaluated in the context of the global COVID-19 pandemic, which necessitated consideration that the patient might be at risk for infection with the SARS-CoV-2 virus that causes COVID-19. Institutional protocols and algorithms that pertain to the  evaluation of patients at risk for COVID-19 are in a state of rapid change based on information released by regulatory bodies including the CDC and federal and state organizations. These policies and algorithms were followed during the patient's care in the ED.     Final Clinical Impression(s) / ED Diagnoses Final diagnoses:  [id number]    Rx / DC Orders ED Discharge Orders    None       Lennice Sites, DO 03/15/20 1147

## 2020-03-15 NOTE — ED Notes (Signed)
ED Provider at bedside. 

## 2020-03-15 NOTE — ED Triage Notes (Signed)
Pt c/o dark orange urine and hemoptysis since this morning. COVID +

## 2020-03-15 NOTE — ED Notes (Signed)
Pt aware urine specimen ordered. Pt reports inability to provide specimen at this time. Specimen collection device provided to patient. 

## 2020-03-15 NOTE — ED Notes (Signed)
Portable Xray at bedside.

## 2020-03-15 NOTE — Discharge Instructions (Addendum)
Continue to check your oxygen levels at home.  If you have persistent low oxygen at less than 90% please return or if you have any other concerning symptoms.

## 2020-03-17 ENCOUNTER — Other Ambulatory Visit: Payer: Self-pay

## 2020-03-17 ENCOUNTER — Telehealth: Payer: Self-pay

## 2020-03-17 ENCOUNTER — Telehealth (INDEPENDENT_AMBULATORY_CARE_PROVIDER_SITE_OTHER): Payer: BC Managed Care – PPO | Admitting: Medical

## 2020-03-17 ENCOUNTER — Encounter: Payer: Self-pay | Admitting: Medical

## 2020-03-17 VITALS — BP 144/74 | HR 93

## 2020-03-17 DIAGNOSIS — J1282 Pneumonia due to coronavirus disease 2019: Secondary | ICD-10-CM | POA: Diagnosis not present

## 2020-03-17 DIAGNOSIS — U071 COVID-19: Secondary | ICD-10-CM

## 2020-03-17 MED ORDER — BENZONATATE 100 MG PO CAPS: 100.0000 mg | ORAL_CAPSULE | Freq: Three times a day (TID) | ORAL | 0 refills | Status: DC | PRN

## 2020-03-17 MED ORDER — AZITHROMYCIN 250 MG PO TABS: ORAL_TABLET | ORAL | 0 refills | Status: DC

## 2020-03-17 NOTE — Progress Notes (Signed)
   Subjective:    Patient ID: Joseph Coleman, male    DOB: 1980/06/21, 39 y.o.   MRN: [id number]  HPI  Virtual Visit via Video Note  I connected with Joseph Coleman on 03/17/20 at  3:20 PM EDT by a video enabled telemedicine application and verified that I am speaking with the correct person using two identifiers.  Location: Patient: home Provider: office   I discussed the limitations of evaluation and management by telemedicine and the availability of in person appointments. The patient expressed understanding and agreed to proceed.  History of Present Illness: Pt went to [place] at [facility] on 03-08-2020 for work up. Pt states on Mar 05, 2020 he had some chills and diffuse body aches.   On Friday he was scheduled to have pcr at [facility]. Then on Saturday decided to get evaluated in ED.  Pt states he has been fatigued. Past Wednesday his fever broke.  Pt states had productive cough and slight sob. Sob has resolved. His 02 sat never got below 94%  Pt states he went to ED again and had some urinary changes in color. Ketones present.  A week ago last Monday had Monoclonal antibodies.  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 But he has been off from work. Wife has been taking care of his son while [name] is recovering.  Pt had bilaltaral opacitities on last xray.   Over about 13 days since onset of symptoms.  Pt just left isolation today.  Pt daughter also positive.   Observations/Objective: General-no acute distress, pleasant, oriented. Lungs- on inspection lungs appear unlabored. Neck- no tracheal deviation or jvd on inspection. Neuro- gross motor function appears intact.   Assessment and Plan: History of covid infection with covid pneumonia on xray most recently.  Will prescribe azithromycin antibiotic and benzonatate for cough.  Continue vit c and zinc.  Discussed best to start taking care of his on Saturday. Minimize contact with son/transfer  until on Saturday.  Follow up for 03-31-2020 or as needed.  Mackie Pai, PA-C  Follow Up Instructions:    I discussed the assessment and treatment plan with the patient. The patient was provided an opportunity to ask questions and all were answered. The patient agreed with the plan and demonstrated an understanding of the instructions.   The patient was advised to call back or seek an in-person evaluation if the symptoms worsen or if the condition fails to improve as anticipated.  Time spent with patient today was  40+ minutes which consisted of ED visit review, discussing diagnoses, work up, treatment, filling out fmla forms, discussing best/safest time frame of johnny to resume care for his son  and documentation.   Mackie Pai, PA-C   Review of Systems  Constitutional: Positive for fatigue. Negative for chills.  HENT: Negative for congestion and ear pain.   Respiratory: Positive for cough. Negative for shortness of breath and wheezing.   Cardiovascular: Negative for chest pain and palpitations.  Gastrointestinal: Negative for abdominal pain.  Musculoskeletal: Negative for back pain.  Neurological: Negative for dizziness, facial asymmetry, speech difficulty, weakness, light-headedness and headaches.  Psychiatric/Behavioral: Negative for behavioral problems, confusion and decreased concentration.       Objective:   Physical Exam        Assessment & Plan:

## 2020-03-17 NOTE — Telephone Encounter (Signed)
Nurse Assessment Nurse: Self, RN, Nira Conn Date/Time (Eastern Time): 03/15/2020 9:35:35 AM Confirm and document reason for call. If symptomatic, describe symptoms. ---Caller says DX Covid , coughing up blood and says urine is reddish. Caller says chest feels tight and some lightheadedness. Does the patient have any new or worsening symptoms? ---Yes Will a triage be completed? ---Yes Related visit to physician within the last 2 weeks? ---Yes Does the PT have any chronic conditions? (i.e. diabetes, asthma, this includes High risk factors for pregnancy, etc.) ---No Is this a behavioral health or substance abuse call? ---No Guidelines Guideline Title Affirmed Question Affirmed Notes Nurse Date/Time ([place] Time) COVID-19 - Diagnosed or Suspected SEVERE or constant chest pain or pressure (Exception: mild central chest pain, present only when coughing) Self, RN, Nira Conn 03/15/2020 9:36:53 AM Disp. Time Eilene Ghazi Time) Disposition Final User 03/15/2020 9:34:41 AM Send to Urgent Queue Baruch Goldmann 03/15/2020 9:38:00 AM Go to ED Now Yes Self, RN, Nira Conn PLEASE NOTE: All timestamps contained within this report are represented as [place] Standard Time. CONFIDENTIALTY NOTICE: This fax transmission is intended only for the addressee. It contains information that is legally privileged, confidential or otherwise protected from use or disclosure. If you are not the intended recipient, you are strictly prohibited from reviewing, disclosing, copying using or disseminating any of this information or taking any action in reliance on or regarding this information. If you have received this fax in error, please notify us immediately by telephone so that we can arrange for its return to [place]. Phone: [phone], Toll-Free: [phone], Fax: [phone] Page: 2 of 2 Call Id: [id number] Caller Disagree/Comply Comply Caller Understands Yes PreDisposition Call Doctor Care Advice Given Per Guideline GO TO ED  NOW: * Leave now. Drive carefully. * You need to be seen in the Emergency Department. * Go to the ED at ___________ [place] AND NOSE: ANOTHER ADULT SHOULD DRIVE: CALL EMS 159 IF: * Severe difficulty breathing occurs CARE ADVICE given per COVID-19 - DIAGNOSED OR SUSPECTED (Adult) guideline. Referrals [facility] - ED  Pt seen in ED.

## 2020-03-17 NOTE — Patient Instructions (Addendum)
History of covid infection with covid pneumonia on xray most recently.  Will prescribe azithromycin antibiotic and benzonatate for cough.  Continue vit c and zinc.  Discussed best to start taking care of his on Saturday. Minimize contact with son/transfer until on Saturday.  Follow up for 03-31-2020 or as needed.

## 2020-03-25 ENCOUNTER — Encounter: Payer: Self-pay | Admitting: Medical

## 2020-03-31 ENCOUNTER — Other Ambulatory Visit: Payer: Self-pay

## 2020-03-31 ENCOUNTER — Ambulatory Visit (INDEPENDENT_AMBULATORY_CARE_PROVIDER_SITE_OTHER): Payer: BC Managed Care – PPO | Admitting: Medical

## 2020-03-31 VITALS — BP 120/77 | HR 63 | Temp 97.7°F | Resp 18 | Ht 68.0 in | Wt 149.2 lb

## 2020-03-31 DIAGNOSIS — Z8616 Personal history of COVID-19: Secondary | ICD-10-CM

## 2020-03-31 DIAGNOSIS — R439 Unspecified disturbances of smell and taste: Secondary | ICD-10-CM | POA: Diagnosis not present

## 2020-03-31 NOTE — Patient Instructions (Addendum)
Hx of covid infection and pneumonia. Based on lung exam and clinical presentation repeat cxr not indicated.  Rare residual cough. No tx needed.  Altered sense of smell/tast some recovered. I think you will recover both.  Follow up as needed but also follow up in May for cpe/wellness.  Pt declines flu vaccine. Can get later as nurse visit.

## 2020-03-31 NOTE — Progress Notes (Signed)
Subjective:    Patient ID: Joseph Coleman, male    DOB: 1981/01/06, 39 y.o.   MRN: [id number]  HPI Pt in for follow up post covid. He now only reports slight taste and smell being off. He states hard to distinguish flavors in detail. Not effecting the amount he is eating.  Normal energy, no myalgia, no shortness of breath. He gets rare random dry cough.  Pt did get monoclonal antibody treatments. He never got vaccine.  He states will probably get booster.  Pt did get covid pneumonia. I prescribed zpack and he states did help.    Review of Systems  Constitutional: Negative for chills, fatigue and fever.  HENT: Negative for congestion.   Respiratory: Negative for cough, chest tightness, shortness of breath and wheezing.   Cardiovascular: Negative for chest pain and palpitations.  Gastrointestinal: Negative for abdominal pain, blood in stool and constipation.  Genitourinary: Negative for dysuria, flank pain and frequency.  Musculoskeletal: Negative for back pain.  Skin: Negative for rash.  Neurological: Negative for dizziness, tremors, weakness, numbness and headaches.  Hematological: Negative for adenopathy. Does not bruise/bleed easily.  Psychiatric/Behavioral: Negative for behavioral problems and confusion.    Past Medical History:  Diagnosis Date  . Epigastric pain 2017     Social History   Socioeconomic History  . Marital status: Married    Spouse name: Not on file  . Number of children: 2  . Years of education: Not on file  . Highest education level: Not on file  Occupational History  . Not on file  Tobacco Use  . Smoking status: Never Smoker  . Smokeless tobacco: Never Used  Vaping Use  . Vaping Use: Never used  Substance and Sexual Activity  . Alcohol use: No    Alcohol/week: 0.0 standard drinks  . Drug use: No  . Sexual activity: Yes    Partners: Female  Other Topics Concern  . Not on file  Social History Narrative  . Not on file   Social  Determinants of Health   Financial Resource Strain:   . Difficulty of Paying Living Expenses: Not on file  Food Insecurity:   . Worried About Charity fundraiser in the Last Year: Not on file  . Ran Out of Food in the Last Year: Not on file  Transportation Needs:   . Lack of Transportation (Medical): Not on file  . Lack of Transportation (Non-Medical): Not on file  Physical Activity:   . Days of Exercise per Week: Not on file  . Minutes of Exercise per Session: Not on file  Stress:   . Feeling of Stress : Not on file  Social Connections:   . Frequency of Communication with Friends and Family: Not on file  . Frequency of Social Gatherings with Friends and Family: Not on file  . Attends Religious Services: Not on file  . Active Member of Clubs or Organizations: Not on file  . Attends Archivist Meetings: Not on file  . Marital Status: Not on file  Intimate Partner Violence:   . Fear of Current or Ex-Partner: Not on file  . Emotionally Abused: Not on file  . Physically Abused: Not on file  . Sexually Abused: Not on file    Past Surgical History:  Procedure Laterality Date  . lipoma removal    . WISDOM TOOTH EXTRACTION  2015    Family History  Problem Relation Age of Onset  . Hyperlipidemia Mother   . Hypertension Mother   .  Diabetes Father   . Alcohol abuse Sister   . Stroke Maternal Grandfather   . Heart attack Paternal Aunt   . Cerebral palsy Son 8  . Premature birth Son 69  . Healthy Daughter 2  . Cancer Neg Hx     Allergies  Allergen Reactions  . Fish Allergy   . Food [Shellfish Allergy]   . Hydrocodone-Homatropine Rash    Current Outpatient Medications on File Prior to Visit  Medication Sig Dispense Refill  . azithromycin (ZITHROMAX) 250 MG tablet Take 2 tablets by mouth on day 1, followed by 1 tablet by mouth daily for 4 days. (Patient not taking: Reported on 03/31/2020) 6 tablet 0  . benzonatate (TESSALON) 100 MG capsule Take 1 capsule (100 mg  total) by mouth 3 (three) times daily as needed. (Patient not taking: Reported on 03/31/2020) 30 capsule 0  . fluticasone (FLONASE) 50 MCG/ACT nasal spray Place 2 sprays into both nostrils daily. (Patient not taking: Reported on 10/01/2019) 16 g 1  . triamcinolone (KENALOG) 0.025 % ointment Apply 1 application topically 2 (two) times daily. (Patient not taking: Reported on 03/31/2020) 30 g 0   No current facility-administered medications on file prior to visit.    BP 120/77   Pulse 63   Temp 97.7 F (36.5 C) (Oral)   Resp 18   Ht 5\' 8"  (1.727 m)   Wt 149 lb 3.2 oz (67.7 kg)   SpO2 100%   BMI 22.69 kg/m       Objective:   Physical Exam  General Mental Status- Alert. General Appearance- Not in acute distress.   Skin General: Color- Normal Color. Moisture- Normal Moisture.  Neck Carotid Arteries- Normal color. Moisture- Normal Moisture. No carotid bruits. No JVD.  Chest and Lung Exam Auscultation: Breath Sounds:-Normal.  Cardiovascular Auscultation:Rythm- Regular. Murmurs & Other Heart Sounds:Auscultation of the heart reveals- No Murmurs.  Abdomen Inspection:-Inspeection Normal. Palpation/Percussion:Note:No mass. Palpation and Percussion of the abdomen reveal- Non Tender, Non Distended + BS, no rebound or guarding.    Neurologic Cranial Nerve exam:- CN III-XII intact(No nystagmus), symmetric smile. Strength:- 5/5 equal and symmetric strength both upper and lower extremities.      Assessment & Plan:  Hx of covid infection and pneumonia. Based on lung exam and clinical presentation repeat cxr not indicated.  Rare residual cough. No tx needed.  Altered sense of smell/tast some recovered. I think you will recover both.  Follow up as needed but also follow up in May for cpe/wellness.  Pt declines flu vaccine. Can get later as nurse visit.    Mackie Pai, PA-C

## 2020-05-12 ENCOUNTER — Encounter: Payer: Self-pay | Admitting: Medical

## 2020-05-12 ENCOUNTER — Telehealth: Payer: Self-pay | Admitting: Medical

## 2020-05-12 ENCOUNTER — Ambulatory Visit (HOSPITAL_BASED_OUTPATIENT_CLINIC_OR_DEPARTMENT_OTHER)
Admission: RE | Admit: 2020-05-12 | Discharge: 2020-05-12 | Disposition: A | Discharge status: Home / Self Care | Payer: BC Managed Care – PPO | Source: Ambulatory Visit | Attending: Medical | Admitting: Medical

## 2020-05-12 ENCOUNTER — Other Ambulatory Visit: Payer: Self-pay

## 2020-05-12 ENCOUNTER — Ambulatory Visit (INDEPENDENT_AMBULATORY_CARE_PROVIDER_SITE_OTHER): Payer: BC Managed Care – PPO | Admitting: Medical

## 2020-05-12 VITALS — BP 125/77 | HR 80 | Temp 97.5°F | Resp 18 | Ht 68.0 in | Wt 153.6 lb

## 2020-05-12 DIAGNOSIS — N50811 Right testicular pain: Secondary | ICD-10-CM | POA: Diagnosis not present

## 2020-05-12 DIAGNOSIS — N5089 Other specified disorders of the male genital organs: Secondary | ICD-10-CM

## 2020-05-12 DIAGNOSIS — N433 Hydrocele, unspecified: Secondary | ICD-10-CM

## 2020-05-12 NOTE — Progress Notes (Signed)
Subjective:    Patient ID: Joseph Coleman, male    DOB: 06/22/1980, 39 y.o.   MRN: [id number]  HPI  Pt in for evaluation.   He noticed a lump on testicle about 3 weeks ago. He states back then the area was more swollen and tender. Now are is less swollen and tender. But was worse. No dc from penis. No rash.   Pt states rt know only tender when he touches area.     Review of Systems  Constitutional: Negative for chills, fatigue and fever.  Respiratory: Negative for cough, chest tightness, shortness of breath and wheezing.   Cardiovascular: Negative for chest pain and palpitations.  Gastrointestinal: Negative for abdominal pain.  Musculoskeletal: Negative for back pain, joint swelling and myalgias.  Neurological: Negative for dizziness and headaches.  Hematological: Negative for adenopathy. Does not bruise/bleed easily.  Psychiatric/Behavioral: Negative for confusion and dysphoric mood.    Past Medical History:  Diagnosis Date   Epigastric pain 2017     Social History   Socioeconomic History   Marital status: Married    Spouse name: Not on file   Number of children: 2   Years of education: Not on file   Highest education level: Not on file  Occupational History   Not on file  Tobacco Use   Smoking status: Never Smoker   Smokeless tobacco: Never Used  Vaping Use   Vaping Use: Never used  Substance and Sexual Activity   Alcohol use: No    Alcohol/week: 0.0 standard drinks   Drug use: No   Sexual activity: Yes    Partners: Female  Other Topics Concern   Not on file  Social History Narrative   Not on file   Social Determinants of Health   Financial Resource Strain: Not on file  Food Insecurity: Not on file  Transportation Needs: Not on file  Physical Activity: Not on file  Stress: Not on file  Social Connections: Not on file  Intimate Partner Violence: Not on file    Past Surgical History:  Procedure Laterality Date   lipoma removal      WISDOM TOOTH EXTRACTION  2015    Family History  Problem Relation Age of Onset   Hyperlipidemia Mother    Hypertension Mother    Diabetes Father    Alcohol abuse Sister    Stroke Maternal Grandfather    Heart attack Paternal Aunt    Cerebral palsy Son 8   Premature birth Son 32   Healthy Daughter 2   Cancer Neg Hx     Allergies  Allergen Reactions   Fish Allergy    Food [employer] Allergy]    Hydrocodone-Homatropine Rash    Current Outpatient Medications on File Prior to Visit  Medication Sig Dispense Refill   azithromycin (ZITHROMAX) 250 MG tablet Take 2 tablets by mouth on day 1, followed by 1 tablet by mouth daily for 4 days. (Patient not taking: No sig reported) 6 tablet 0   benzonatate (TESSALON) 100 MG capsule Take 1 capsule (100 mg total) by mouth 3 (three) times daily as needed. (Patient not taking: No sig reported) 30 capsule 0   fluticasone (FLONASE) 50 MCG/ACT nasal spray Place 2 sprays into both nostrils daily. (Patient not taking: No sig reported) 16 g 1   triamcinolone (KENALOG) 0.025 % ointment Apply 1 application topically 2 (two) times daily. (Patient not taking: No sig reported) 30 g 0   No current facility-administered medications on file prior to visit.  BP 125/77    Pulse 80    Temp (!) 97.5 F (36.4 C) (Oral)    Resp 18    Ht 5\' 8"  (1.727 m)    Wt 153 lb 9.6 oz (69.7 kg)    SpO2 100%    BMI 23.35 kg/m       Objective:   Physical Exam  General- No acute distress. Pleasant patient. Lungs- Clear, even and unlabored. Heart- regular rate and rhythm. Neurologic- CNII- XII grossly intact. Genital- rt testicle small lump. Left testicle.         Assessment & Plan:  You have small lump in testicle presently rt side. On exam feels like possible appendix of testes vs possible epididymitis.   Will get US scrotum/doppler today. Will follow [place] report and notify you. After [place] decide if you need antibiotic for possible  epididymitis.  Follow up date to be determined after [place] review.  [phone].  Time spent with patient today was 20  minutes which consisted of chart rediew, discussing diagnosis, work up, potential treatment, answering questions/concerns  and documentation.

## 2020-05-12 NOTE — Telephone Encounter (Signed)
Referral to urologist placed. 

## 2020-05-12 NOTE — Patient Instructions (Addendum)
You have small lump in testicle presently rt side. On exam feels like possible appendix of testes vs possible epididymitis.   Will get US scrotum/doppler today. Will follow [place] report and notify you. After [place] decide if you need antibiotic for possible epididymitis.  Follow up date to be determined after [place] review.

## 2020-05-14 ENCOUNTER — Telehealth: Payer: Self-pay | Admitting: Medical

## 2020-05-14 DIAGNOSIS — N433 Hydrocele, unspecified: Secondary | ICD-10-CM

## 2020-05-14 NOTE — Telephone Encounter (Signed)
Refer to [facility]. Sorry for mistake on gi.

## 2020-05-25 ENCOUNTER — Telehealth: Payer: BC Managed Care – PPO | Admitting: Family

## 2020-05-25 DIAGNOSIS — B9689 Other specified bacterial agents as the cause of diseases classified elsewhere: Secondary | ICD-10-CM | POA: Diagnosis not present

## 2020-05-25 DIAGNOSIS — J019 Acute sinusitis, unspecified: Secondary | ICD-10-CM | POA: Diagnosis not present

## 2020-05-25 MED ORDER — AMOXICILLIN-POT CLAVULANATE 875-125 MG PO TABS: 1.0000 | ORAL_TABLET | Freq: Two times a day (BID) | ORAL | 0 refills | Status: DC

## 2020-05-25 NOTE — Progress Notes (Signed)

## 2020-05-27 DIAGNOSIS — N503 Cyst of epididymis: Secondary | ICD-10-CM | POA: Diagnosis not present

## 2020-06-03 ENCOUNTER — Encounter: Payer: Self-pay | Admitting: Medical

## 2020-06-03 ENCOUNTER — Ambulatory Visit (INDEPENDENT_AMBULATORY_CARE_PROVIDER_SITE_OTHER): Payer: BC Managed Care – PPO | Admitting: Medical

## 2020-06-03 ENCOUNTER — Other Ambulatory Visit: Payer: Self-pay

## 2020-06-03 VITALS — BP 110/78 | HR 88 | Temp 97.3°F | Ht 68.0 in | Wt 154.0 lb

## 2020-06-03 DIAGNOSIS — D179 Benign lipomatous neoplasm, unspecified: Secondary | ICD-10-CM | POA: Diagnosis not present

## 2020-06-03 DIAGNOSIS — L709 Acne, unspecified: Secondary | ICD-10-CM | POA: Diagnosis not present

## 2020-06-03 DIAGNOSIS — R1032 Left lower quadrant pain: Secondary | ICD-10-CM

## 2020-06-03 NOTE — Progress Notes (Addendum)
Subjective:    Patient ID: Joseph Coleman, male    DOB: 04/06/1981, 40 y.o.   MRN: [id number]  HPI  Pt in with left side scapula that looks like slight raised area. Wife palpated the area during a massage. Pt has had lipoma on back of his scalp left side in past.  No tenderness. No discharge.  Also some acne on his back.  Pt also mentions intermittent mild left lower quadrant cramping over past 2 years. Sometimes associates with eating sugar or chocolates. No association to diarrhea or constipation. No family history of polyps or colon cancers. Pt states he in past also noted association with left lower abdomen in past when he drank a lot of coffee. He stopped drinking coffee about 2 weeks ago. When he eats very healthy,stops high sugar foods and mochas pain resolves. Last time any signficant pain was 2 weeks ago. Now subtle faint left side abd pain.     Review of Systems  Constitutional: Negative for chills, fatigue and fever.  Respiratory: Negative for cough, chest tightness, shortness of breath and wheezing.   Cardiovascular: Negative for chest pain and palpitations.  Gastrointestinal: Negative for abdominal pain.  Musculoskeletal: Negative for back pain.  Skin: Negative for rash.       Lump on back.  Neurological: Negative for dizziness, speech difficulty, weakness, numbness and headaches.  Hematological: Negative for adenopathy. Does not bruise/bleed easily.  Psychiatric/Behavioral: Negative for behavioral problems and confusion.    Past Medical History:  Diagnosis Date  . Epigastric pain 2017     Social History   Socioeconomic History  . Marital status: Married    Spouse name: Not on file  . Number of children: 2  . Years of education: Not on file  . Highest education level: Not on file  Occupational History  . Not on file  Tobacco Use  . Smoking status: Never Smoker  . Smokeless tobacco: Never Used  Vaping Use  . Vaping Use: Never used  Substance and Sexual  Activity  . Alcohol use: No    Alcohol/week: 0.0 standard drinks  . Drug use: No  . Sexual activity: Yes    Partners: Female  Other Topics Concern  . Not on file  Social History Narrative  . Not on file   Social Determinants of Health   Financial Resource Strain: Not on file  Food Insecurity: Not on file  Transportation Needs: Not on file  Physical Activity: Not on file  Stress: Not on file  Social Connections: Not on file  Intimate Partner Violence: Not on file    Past Surgical History:  Procedure Laterality Date  . lipoma removal    . WISDOM TOOTH EXTRACTION  2015    Family History  Problem Relation Age of Onset  . Hyperlipidemia Mother   . Hypertension Mother   . Diabetes Father   . Alcohol abuse Sister   . Stroke Maternal Grandfather   . Heart attack Paternal Aunt   . Cerebral palsy Son 8  . Premature birth Son 8  . Healthy Daughter 2  . Cancer Neg Hx     Allergies  Allergen Reactions  . Fish Allergy   . Food [Shellfish Allergy]   . Hydrocodone-Homatropine Rash    Current Outpatient Medications on File Prior to Visit  Medication Sig Dispense Refill  . triamcinolone (KENALOG) 0.025 % ointment Apply 1 application topically 2 (two) times daily. 30 g 0  . amoxicillin-clavulanate (AUGMENTIN) 875-125 MG tablet Take 1 tablet by  mouth 2 (two) times daily. (Patient not taking: Reported on 06/03/2020) 14 tablet 0  . azithromycin (ZITHROMAX) 250 MG tablet Take 2 tablets by mouth on day 1, followed by 1 tablet by mouth daily for 4 days. (Patient not taking: No sig reported) 6 tablet 0  . benzonatate (TESSALON) 100 MG capsule Take 1 capsule (100 mg total) by mouth 3 (three) times daily as needed. (Patient not taking: No sig reported) 30 capsule 0  . fluticasone (FLONASE) 50 MCG/ACT nasal spray Place 2 sprays into both nostrils daily. (Patient not taking: No sig reported) 16 g 1   No current facility-administered medications on file prior to visit.    BP 110/78 (BP  Location: Left Arm, Patient Position: Sitting, Cuff Size: Normal)   Pulse 88   Temp (!) 97.3 F (36.3 C) (Oral)   Ht 5\' 8"  (1.727 m)   Wt 154 lb (69.9 kg)   SpO2 98%   BMI 23.42 kg/m      Objective:   Physical Exam  General- No acute distress. Pleasant patient. Neck- Full range of motion, no jvd Lungs- Clear, even and unlabored. Heart- regular rate and rhythm. Neurologic- CNII- XII grossly intact. Derm- left side scapula. sebaceous cyst vs lipoma.  Abdomen- very faint low level left quadrant pain.      Assessment & Plan:  You do have lump on back. By exam appears to be lipoma vs sebaceous cyst. Refer to general surgeon.  For left lower quadrant over past 2 years did got ahead and refer you to GI MD. Recommend eat healthy diet as you report this helps. If you have constant moderate to severe pain might give short course of antibiotic pending gi referral. If pain returns would rx cipro and flagyl.  Follow up in 6 months or as needed.  Mackie Pai, PA-C

## 2020-06-03 NOTE — Patient Instructions (Addendum)
You do have lump on back. By exam appears to be lipoma vs sebaceous cyst. Refer to general surgeon  For left lower quadrant over past 2 years did got ahead and refer you to GI MD. Recommend eat healthy diet as you report this helps. If you have recurrent constant moderate to severe pain might give short course of antibiotic pending gi referral. If pain returns would rx cipro and flagyl.  Follow up in 6 months or as needed.

## 2020-06-18 ENCOUNTER — Encounter: Payer: Self-pay | Admitting: Gastroenterology

## 2020-06-24 ENCOUNTER — Encounter: Payer: Self-pay | Admitting: Medical

## 2020-06-30 DIAGNOSIS — D171 Benign lipomatous neoplasm of skin and subcutaneous tissue of trunk: Secondary | ICD-10-CM | POA: Diagnosis not present

## 2020-07-07 ENCOUNTER — Encounter: Payer: Self-pay | Admitting: Gastroenterology

## 2020-07-07 ENCOUNTER — Ambulatory Visit (INDEPENDENT_AMBULATORY_CARE_PROVIDER_SITE_OTHER): Payer: BC Managed Care – PPO | Admitting: Gastroenterology

## 2020-07-07 ENCOUNTER — Other Ambulatory Visit (INDEPENDENT_AMBULATORY_CARE_PROVIDER_SITE_OTHER): Payer: BC Managed Care – PPO

## 2020-07-07 ENCOUNTER — Other Ambulatory Visit: Payer: Self-pay

## 2020-07-07 VITALS — BP 112/74 | HR 90 | Ht 68.0 in | Wt 153.5 lb

## 2020-07-07 DIAGNOSIS — R1012 Left upper quadrant pain: Secondary | ICD-10-CM | POA: Diagnosis not present

## 2020-07-07 DIAGNOSIS — D509 Iron deficiency anemia, unspecified: Secondary | ICD-10-CM | POA: Diagnosis not present

## 2020-07-07 LAB — CBC
HCT: 41.4 % (ref 39.0–52.0)
Hemoglobin: 13.1 g/dL (ref 13.0–17.0)
MCHC: 31.7 g/dL (ref 30.0–36.0)
MCV: 66.3 fl — ABNORMAL LOW (ref 78.0–100.0)
Platelets: 292 10*3/uL (ref 150.0–400.0)
RBC: 6.25 Mil/uL — ABNORMAL HIGH (ref 4.22–5.81)
RDW: 15.1 % (ref 11.5–15.5)
WBC: 5.2 10*3/uL (ref 4.0–10.5)

## 2020-07-07 LAB — FERRITIN: Ferritin: 382.9 ng/mL — ABNORMAL HIGH (ref 22.0–322.0)

## 2020-07-07 NOTE — Progress Notes (Signed)
Chief Complaint: LUQ pain   Referring Provider:     Mackie Pai, PA-C   HPI:     Joseph Coleman is a 40 y.o. male referred [facility] for evaluation of LUQ pain.   Sxs have been present for the last 1-1.5 years. Described as stabbing pain in LUQ/MEG. Sxs can last 1 minute to a few hours. No associated n/v/f/c/d/c, melena, hematochezia. No radiation. Sxs worse with coffee (was drinking 6 cups/day), soda (drank rarely), which he has since stopped. Last episode was after drinking soda last month. Has not tiraled any meds for this. No previous EGD/Colonoscopy.   -02/2020: Normal CMP. H/H 12.4/39.9 with MCV 66.4 -01/2018: H/H 13.9/43.7, MCV 66.4(stable from previous)  No known family history of CRC, GI malignancy, liver disease, pancreatic disease, or IBD.    Past Medical History:  Diagnosis Date  . Epigastric pain 2017     Past Surgical History:  Procedure Laterality Date  . lipoma removal    . WISDOM TOOTH EXTRACTION  2015   Family History  Problem Relation Age of Onset  . Hyperlipidemia Mother   . Hypertension Mother   . Diabetes Father   . Alcohol abuse Sister   . Stroke Maternal Grandfather   . Heart attack Paternal Aunt   . Cerebral palsy Son 8  . Premature birth Son 42  . Healthy Daughter 2  . Cancer Neg Hx    Social History   Tobacco Use  . Smoking status: Never Smoker  . Smokeless tobacco: Never Used  Vaping Use  . Vaping Use: Never used  Substance Use Topics  . Alcohol use: No    Alcohol/week: 0.0 standard drinks  . Drug use: No   No current outpatient medications on file.   No current facility-administered medications for this visit.   Allergies  Allergen Reactions  . Fish Allergy   . Food [Shellfish Allergy]   . Hydrocodone-Homatropine Rash     Review of Systems: All systems reviewed and negative except where noted in HPI.     Physical Exam:    Wt Readings from Last 3 Encounters:  07/07/20 153  lb 8 oz (69.6 kg)  06/03/20 154 lb (69.9 kg)  05/12/20 153 lb 9.6 oz (69.7 kg)    BP 112/74 (BP Location: Right Arm, Patient Position: Sitting, Cuff Size: Normal)   Pulse 90   Ht 5\' 8"  (1.727 m)   Wt 153 lb 8 oz (69.6 kg)   BMI 23.34 kg/m  Constitutional:  Pleasant, in no acute distress. Psychiatric: Normal mood and affect. Behavior is normal. EENT: Pupils normal.  Conjunctivae are normal. No scleral icterus. Neck supple. No cervical LAD. Cardiovascular: Normal rate, regular rhythm. No edema Pulmonary/chest: Effort normal and breath sounds normal. No wheezing, rales or rhonchi. Abdominal: Soft, nondistended, nontender. Bowel sounds active throughout. There are no masses palpable. No hepatomegaly. Neurological: Alert and oriented to person place and time. Skin: Skin is warm and dry. No rashes noted.   ASSESSMENT AND PLAN;   1) LUQ pain -EGD to evaluate for mucosal/luminal pathology with gastric biopsies -Medications pending endoscopic findings -Continue avoiding exacerbating foods  2) Microcytic anemia - Check iron panel and repeat CBC -Evaluate for mucosal/luminal pathology at time of EGD as above with gastric/duodenal biopsies as appropriate  The indications, risks, and benefits of EGD were explained to the patient in detail. Risks include but are not limited to bleeding, perforation, adverse reaction  to medications, and cardiopulmonary compromise. Sequelae include but are not limited to the possibility of surgery, hospitalization, and mortality. The patient verbalized understanding and wished to proceed. All questions answered, referred to scheduler. Further recommendations pending results of the exam.    Lavena Bullion, DO, FACG  07/07/2020, 11:28 AM   Saguier, Percell Miller, PA-C

## 2020-07-07 NOTE — Patient Instructions (Addendum)
If you are age 41 or older, your body mass index should be between 23-30. Your Body mass index is 23.34 kg/m. If this is out of the aforementioned range listed, please consider follow up with your Primary Care Provider.  If you are age 52 or younger, your body mass index should be between 19-25. Your Body mass index is 23.34 kg/m. If this is out of the aformentioned range listed, please consider follow up with your Primary Care Provider.   You have been scheduled for an endoscopy. Please follow written instructions given to you at your visit today. If you use inhalers (even only as needed), please bring them with you on the day of your procedure.  Please go to the lab on the 2nd floor [address] before you leave the office today.

## 2020-07-08 DIAGNOSIS — R222 Localized swelling, mass and lump, trunk: Secondary | ICD-10-CM | POA: Diagnosis not present

## 2020-07-08 DIAGNOSIS — D1779 Benign lipomatous neoplasm of other sites: Secondary | ICD-10-CM | POA: Diagnosis not present

## 2020-07-08 LAB — IRON, TOTAL/TOTAL IRON BINDING CAP
%SAT: 31 % (calc) (ref 20–48)
Iron: 83 ug/dL (ref 50–180)
TIBC: 264 mcg/dL (calc) (ref 250–425)

## 2020-07-23 DIAGNOSIS — D171 Benign lipomatous neoplasm of skin and subcutaneous tissue of trunk: Secondary | ICD-10-CM | POA: Diagnosis not present

## 2020-08-05 ENCOUNTER — Encounter: Payer: Self-pay | Admitting: Gastroenterology

## 2020-08-14 DIAGNOSIS — Z1152 Encounter for screening for COVID-19: Secondary | ICD-10-CM | POA: Diagnosis not present

## 2020-08-18 ENCOUNTER — Ambulatory Visit (AMBULATORY_SURGERY_CENTER): Payer: BC Managed Care – PPO | Admitting: Gastroenterology

## 2020-08-18 ENCOUNTER — Other Ambulatory Visit: Payer: Self-pay | Admitting: Gastroenterology

## 2020-08-18 ENCOUNTER — Encounter: Payer: Self-pay | Admitting: Gastroenterology

## 2020-08-18 ENCOUNTER — Other Ambulatory Visit: Payer: Self-pay

## 2020-08-18 VITALS — BP 114/76 | HR 66 | Temp 97.5°F | Resp 18 | Ht 68.0 in | Wt 153.0 lb

## 2020-08-18 DIAGNOSIS — K2970 Gastritis, unspecified, without bleeding: Secondary | ICD-10-CM | POA: Diagnosis not present

## 2020-08-18 DIAGNOSIS — R1012 Left upper quadrant pain: Secondary | ICD-10-CM

## 2020-08-18 DIAGNOSIS — K2950 Unspecified chronic gastritis without bleeding: Secondary | ICD-10-CM | POA: Diagnosis not present

## 2020-08-18 DIAGNOSIS — R109 Unspecified abdominal pain: Secondary | ICD-10-CM | POA: Diagnosis not present

## 2020-08-18 MED ORDER — SODIUM CHLORIDE 0.9 % IV SOLN: 500.0000 mL | INTRAVENOUS | Status: DC

## 2020-08-18 NOTE — Progress Notes (Signed)
Called to room to assist during endoscopic procedure.  Patient ID and intended procedure confirmed with present staff. Received instructions for my participation in the procedure from the performing physician.  

## 2020-08-18 NOTE — Patient Instructions (Signed)
YOU HAD AN ENDOSCOPIC PROCEDURE TODAY AT THE [facility]:   Refer to the procedure report that was given to you for any specific questions about what was found during the examination.  If the procedure report does not answer your questions, please call your gastroenterologist to clarify.  If you requested that your care partner not be given the details of your procedure findings, then the procedure report has been included in a sealed envelope for you to review at your convenience later.  YOU SHOULD EXPECT: Some feelings of bloating in the abdomen. Passage of more gas than usual.  Walking can help get rid of the air that was put into your GI tract during the procedure and reduce the bloating. If you had a lower endoscopy (such as a colonoscopy or flexible sigmoidoscopy) you may notice spotting of blood in your stool or on the toilet paper. If you underwent a bowel prep for your procedure, you may not have a normal bowel movement for a few days.  Please Note:  You might notice some irritation and congestion in your nose or some drainage.  This is from the oxygen used during your procedure.  There is no need for concern and it should clear up in a day or so.  SYMPTOMS TO REPORT IMMEDIATELY:    Following upper endoscopy (EGD)  Vomiting of blood or coffee ground material  New chest pain or pain under the shoulder blades  Painful or persistently difficult swallowing  New shortness of breath  Fever of 100F or higher  Black, tarry-looking stools  For urgent or emergent issues, a gastroenterologist can be reached at any hour by calling [phone]. Do not use MyChart messaging for urgent concerns.    DIET:  We do recommend a small meal at first, but then you may proceed to your regular diet.  Drink plenty of fluids but you should avoid alcoholic beverages for 24 hours.  ACTIVITY:  You should plan to take it easy for the rest of today and you should NOT DRIVE or use heavy machinery  until tomorrow (because of the sedation medicines used during the test).    FOLLOW UP: Our staff will call the number listed on your records 48-72 hours following your procedure to check on you and address any questions or concerns that you may have regarding the information given to you following your procedure. If we do not reach you, we will leave a message.  We will attempt to reach you two times.  During this call, we will ask if you have developed any symptoms of COVID 19. If you develop any symptoms (ie: fever, flu-like symptoms, shortness of breath, cough etc.) before then, please call [phone].  If you test positive for Covid 19 in the 2 weeks post procedure, please call and report this information to us.    If any biopsies were taken you will be contacted by phone or by letter within the next 1-3 weeks.  Please call us at [phone] if you have not heard about the biopsies in 3 weeks.    SIGNATURES/CONFIDENTIALITY: You and/or your care partner have signed paperwork which will be entered into your electronic medical record.  These signatures attest to the fact that that the information above on your After Visit Summary has been reviewed and is understood.  Full responsibility of the confidentiality of this discharge information lies with you and/or your care-partner. 

## 2020-08-18 NOTE — Progress Notes (Signed)
To pacu, VSS. Report to [email or web address] 

## 2020-08-18 NOTE — Op Note (Signed)
[facility] Patient Name: Joseph Coleman Procedure Date: 08/18/2020 10:59 AM MRN: [id number] Endoscopist: Gerrit Heck , MD Age: 40 Referring MD:  Date of Birth: December 26, 1980 Gender: Male Account #: [id number] Procedure:                Upper GI endoscopy Indications:              Epigastric abdominal pain, Abdominal pain in the                            left upper quadrant Medicines:                Monitored Anesthesia Care Procedure:                Pre-Anesthesia Assessment:                           - Prior to the procedure, a History and Physical                            was performed, and patient medications and                            allergies were reviewed. The patient's tolerance of                            previous anesthesia was also reviewed. The risks                            and benefits of the procedure and the sedation                            options and risks were discussed with the patient.                            All questions were answered, and informed consent                            was obtained. Prior Anticoagulants: The patient has                            taken no previous anticoagulant or antiplatelet                            agents. ASA Grade Assessment: II - A patient with                            mild systemic disease. After reviewing the risks                            and benefits, the patient was deemed in                            satisfactory condition to undergo the procedure.  After obtaining informed consent, the endoscope was                            passed under direct vision. Throughout the                            procedure, the patient's blood pressure, pulse, and                            oxygen saturations were monitored continuously. The                            Endoscope was introduced through the mouth, and                            advanced to the second part of  duodenum. The upper                            GI endoscopy was accomplished without difficulty.                            The patient tolerated the procedure well. Scope In: Scope Out: Findings:                 The examined esophagus was normal.                           The Z-line was regular and was found 42 cm from the                            incisors.                           The entire examined stomach was normal. Biopsies                            were taken with a cold forceps for Helicobacter                            pylori testing. Estimated blood loss was minimal.                           The examined duodenum was normal. Complications:            No immediate complications. Estimated Blood Loss:     Estimated blood loss was minimal. Impression:               - Normal esophagus.                           - Z-line regular, 42 cm from the incisors.                           - Normal stomach. Biopsied.                           -  Normal examined duodenum. Recommendation:           - Patient has a contact number available for                            emergencies. The signs and symptoms of potential                            delayed complications were discussed with the                            patient. Return to normal activities tomorrow.                            Written discharge instructions were provided to the                            patient.                           - Resume previous diet.                           - Continue present medications.                           - Await pathology results.                           - Return to GI clinic PRN. Gerrit Heck, MD 08/18/2020 11:23:29 AM

## 2020-08-20 ENCOUNTER — Telehealth: Payer: Self-pay | Admitting: *Deleted

## 2020-08-20 NOTE — Telephone Encounter (Signed)
  Follow up Call-  Call back number 08/18/2020  Post procedure Call Back phone  # [phone]  Permission to leave phone message Yes  Some recent data might be hidden     Patient questions:  Do you have a fever, pain , or abdominal swelling? No. Pain Score  0 *  Have you tolerated food without any problems? Yes.    Have you been able to return to your normal activities? Yes.    Do you have any questions about your discharge instructions: Diet   No. Medications  No. Follow up visit  No.  Do you have questions or concerns about your Care? No.  Actions: * If pain score is 4 or above: 1. No action needed, pain <4.Have you developed a fever since your procedure? no  2.   Have you had an respiratory symptoms (SOB or cough) since your procedure? no  3.   Have you tested positive for COVID 19 since your procedure no  4.   Have you had any family members/close contacts diagnosed with the COVID 19 since your procedure?  no   If yes to any of these questions please route to Joylene John, RN and Joella Prince, RN

## 2020-08-28 ENCOUNTER — Encounter: Payer: Self-pay | Admitting: Gastroenterology

## 2020-11-07 ENCOUNTER — Telehealth: Payer: BC Managed Care – PPO | Admitting: Physician Assistant

## 2020-11-07 DIAGNOSIS — J069 Acute upper respiratory infection, unspecified: Secondary | ICD-10-CM | POA: Diagnosis not present

## 2020-11-07 MED ORDER — BENZONATATE 100 MG PO CAPS: 100.0000 mg | ORAL_CAPSULE | Freq: Three times a day (TID) | ORAL | 0 refills | Status: DC | PRN

## 2020-11-07 MED ORDER — FLUTICASONE PROPIONATE 50 MCG/ACT NA SUSP: 2.0000 | Freq: Every day | NASAL | 0 refills | Status: DC

## 2020-11-07 NOTE — Progress Notes (Signed)
We are sorry you are not feeling well.  Here is how we plan to help!  Based on what you have shared with me, it looks like you may have a viral upper respiratory infection.  Upper respiratory infections are caused by a large number of viruses; however, rhinovirus is the most common cause.   Symptoms vary from person to person, with common symptoms including sore throat, cough, fatigue or lack of energy and feeling of general discomfort.  A low-grade fever of up to 100.4 may present, but is often uncommon.  Symptoms vary however, and are closely related to a person's age or underlying illnesses.  The most common symptoms associated with an upper respiratory infection are nasal discharge or congestion, cough, sneezing, headache and pressure in the ears and face.  These symptoms usually persist for about 3 to 10 days, but can last up to 2 weeks.  It is important to know that upper respiratory infections do not cause serious illness or complications in most cases.    Upper respiratory infections can be transmitted from person to person, with the most common method of transmission being a person's hands.  The virus is able to live on the skin and can infect other persons for up to 2 hours after direct contact.  Also, these can be transmitted when someone coughs or sneezes; thus, it is important to cover the mouth to reduce this risk.  To keep the spread of the illness at bay, good hand hygiene is very important.  This is an infection that is most likely caused by a virus. There are no specific treatments other than to help you with the symptoms until the infection runs its course.  We are sorry you are not feeling well.  Here is how we plan to help!   For nasal congestion, you may use an oral decongestants such as Mucinex D or if you have glaucoma or high blood pressure use plain Mucinex.  Saline nasal spray or nasal drops can help and can safely be used as often as needed for congestion.  For your congestion,  I have prescribed Fluticasone nasal spray one spray in each nostril twice a day  If you do not have a history of heart disease, hypertension, diabetes or thyroid disease, prostate/bladder issues or glaucoma, you may also use Sudafed to treat nasal congestion.  It is highly recommended that you consult with a pharmacist or your primary care physician to ensure this medication is safe for you to take.     If you have a cough, you may use cough suppressants such as Delsym and Robitussin.  If you have glaucoma or high blood pressure, you can also use Coricidin HBP.   For cough I have prescribed for you A prescription cough medication called Tessalon Perles 100 mg. You may take 1-2 capsules every 8 hours as needed for cough  If you have a sore or scratchy throat, use a saltwater gargle-  to  teaspoon of salt dissolved in a 4-ounce to 8-ounce glass of warm water.  Gargle the solution for approximately 15-30 seconds and then spit.  It is important not to swallow the solution.  You can also use throat lozenges/cough drops and Chloraseptic spray to help with throat pain or discomfort.  Warm or cold liquids can also be helpful in relieving throat pain.  For headache, pain or general discomfort, you can use Ibuprofen or Tylenol as directed.   Some authorities believe that zinc sprays or the use of   Echinacea may shorten the course of your symptoms.   HOME CARE Only take medications as instructed by your medical team. Be sure to drink plenty of fluids. Water is fine as well as fruit juices, sodas and electrolyte beverages. You may want to stay away from caffeine or alcohol. If you are nauseated, try taking small sips of liquids. How do you know if you are getting enough fluid? Your urine should be a pale yellow or almost colorless. Get rest. Taking a steamy shower or using a humidifier may help nasal congestion and ease sore throat pain. You can place a towel over your head and breathe in the steam from hot  water coming from a faucet. Using a saline nasal spray works much the same way. Cough drops, hard candies and sore throat lozenges may ease your cough. Avoid close contacts especially the very young and the elderly Cover your mouth if you cough or sneeze Always remember to wash your hands.   GET HELP RIGHT AWAY IF: You develop worsening fever. If your symptoms do not improve within 10 days You develop yellow or green discharge from your nose over 3 days. You have coughing fits You develop a severe head ache or visual changes. You develop shortness of breath, difficulty breathing or start having chest pain Your symptoms persist after you have completed your treatment plan  MAKE SURE YOU  Understand these instructions. Will watch your condition. Will get help right away if you are not doing well or get worse.  Your e-visit answers were reviewed by a board certified advanced clinical practitioner to complete your personal care plan. Depending upon the condition, your plan could have included both over the counter or prescription medications. Please review your pharmacy choice. If there is a problem, you may call our nursing hot line at and have the prescription routed to another pharmacy. Your safety is important to [place]. If you have drug allergies check your prescription carefully.   You can use MyChart to ask questions about today's visit, request a non-urgent call back, or ask for a work or school excuse for 24 hours related to this e-Visit. If it has been greater than 24 hours you will need to follow up with your provider, or enter a new e-Visit to address those concerns. You will get an e-mail in the next two days asking about your experience.  I hope that your e-visit has been valuable and will speed your recovery. Thank you for using e-visits.  I provided 6 minutes of non face-to-face time during this encounter for chart review and documentation.

## 2021-09-01 ENCOUNTER — Encounter: Payer: Commercial Managed Care - PPO | Admitting: Medical

## 2021-09-02 ENCOUNTER — Ambulatory Visit (INDEPENDENT_AMBULATORY_CARE_PROVIDER_SITE_OTHER): Payer: Commercial Managed Care - PPO | Admitting: Medical

## 2021-09-02 VITALS — BP 114/80 | HR 73 | Temp 98.2°F | Resp 18 | Ht 68.0 in | Wt 164.0 lb

## 2021-09-02 DIAGNOSIS — Z833 Family history of diabetes mellitus: Secondary | ICD-10-CM

## 2021-09-02 DIAGNOSIS — Z0000 Encounter for general adult medical examination without abnormal findings: Secondary | ICD-10-CM

## 2021-09-02 LAB — COMPREHENSIVE METABOLIC PANEL
ALT: 31 U/L (ref 0–53)
AST: 20 U/L (ref 0–37)
Albumin: 4.6 g/dL (ref 3.5–5.2)
Alkaline Phosphatase: 48 U/L (ref 39–117)
BUN: 13 mg/dL (ref 6–23)
CO2: 30 mEq/L (ref 19–32)
Calcium: 9.5 mg/dL (ref 8.4–10.5)
Chloride: 102 mEq/L (ref 96–112)
Creatinine, Ser: 1.01 mg/dL (ref 0.40–1.50)
GFR: 92.22 mL/min (ref >60.00)
Glucose, Bld: 92 mg/dL (ref 70–99)
Potassium: 4.1 mEq/L (ref 3.5–5.1)
Sodium: 138 mEq/L (ref 135–145)
Total Bilirubin: 0.5 mg/dL (ref 0.2–1.2)
Total Protein: 7.1 g/dL (ref 6.0–8.3)

## 2021-09-02 LAB — CBC WITH DIFFERENTIAL/PLATELET
Basophils Absolute: 0 10*3/uL (ref 0.0–0.1)
Basophils Relative: 0.4 % (ref 0.0–3.0)
Eosinophils Absolute: 0.2 10*3/uL (ref 0.0–0.7)
Eosinophils Relative: 3.7 % (ref 0.0–5.0)
HCT: 41.3 % (ref 39.0–52.0)
Hemoglobin: 13.1 g/dL (ref 13.0–17.0)
Lymphocytes Relative: 27.2 % (ref 12.0–46.0)
Lymphs Abs: 1.7 10*3/uL (ref 0.7–4.0)
MCHC: 31.6 g/dL (ref 30.0–36.0)
MCV: 66.5 fl — ABNORMAL LOW (ref 78.0–100.0)
Monocytes Absolute: 0.5 10*3/uL (ref 0.1–1.0)
Monocytes Relative: 8.5 % (ref 3.0–12.0)
Neutro Abs: 3.7 10*3/uL (ref 1.4–7.7)
Neutrophils Relative %: 60.2 % (ref 43.0–77.0)
Platelets: 312 10*3/uL (ref 150.0–400.0)
RBC: 6.22 Mil/uL — ABNORMAL HIGH (ref 4.22–5.81)
RDW: 14.9 % (ref 11.5–15.5)
WBC: 6.2 10*3/uL (ref 4.0–10.5)

## 2021-09-02 LAB — LIPID PANEL
Cholesterol: 184 mg/dL (ref 0–200)
HDL: 51 mg/dL (ref >39.00)
LDL Cholesterol: 121 mg/dL — ABNORMAL HIGH (ref 0–99)
NonHDL: 133.38
Total CHOL/HDL Ratio: 4
Triglycerides: 63 mg/dL (ref 0.0–149.0)
VLDL: 12.6 mg/dL (ref 0.0–40.0)

## 2021-09-02 NOTE — Addendum Note (Signed)
Addended by: Anabel Halon on: 09/02/2021 08:27 AM ? ? Modules accepted: Orders ? ?

## 2021-09-02 NOTE — Patient Instructions (Addendum)
For you wellness exam today I have ordered cbc, cmp and  lipid panel. ? ?Vaccine up to date. ? ?Recommend exercise and healthy diet. ? ?We will let you know lab results as they come in. ? ?Follow up date appointment will be determined after lab review.   ? ? ? ?Preventive Care 95-42 Years Old, Male ?Preventive care refers to lifestyle choices and visits with your health care provider that can promote health and wellness. Preventive care visits are also called wellness exams. ?What can I expect for my preventive care visit? ?Counseling ?During your preventive care visit, your health care provider may ask about your: ?Medical history, including: ?Past medical problems. ?Family medical history. ?Current health, including: ?Emotional well-being. ?Home life and relationship well-being. ?Sexual activity. ?Lifestyle, including: ?Alcohol, nicotine or tobacco, and drug use. ?Access to firearms. ?Diet, exercise, and sleep habits. ?Safety issues such as seatbelt and bike helmet use. ?Sunscreen use. ?Work and work Statistician. ?Physical exam ?Your health care provider will check your: ?Height and weight. These may be used to calculate your BMI (body mass index). BMI is a measurement that tells if you are at a healthy weight. ?Waist circumference. This measures the distance around your waistline. This measurement also tells if you are at a healthy weight and may help predict your risk of certain diseases, such as type 2 diabetes and high blood pressure. ?Heart rate and blood pressure. ?Body temperature. ?Skin for abnormal spots. ?What immunizations do I need? ?Vaccines are usually given at various ages, according to a schedule. Your health care provider will recommend vaccines for you based on your age, medical history, and lifestyle or other factors, such as travel or where you work. ?What tests do I need? ?Screening ?Your health care provider may recommend screening tests for certain conditions. This may include: ?Lipid and  cholesterol levels. ?Diabetes screening. This is done by checking your blood sugar (glucose) after you have not eaten for a while (fasting). ?Hepatitis B test. ?Hepatitis C test. ?HIV (human immunodeficiency virus) test. ?STI (sexually transmitted infection) testing, if you are at risk. ?Lung cancer screening. ?Prostate cancer screening. ?Colorectal cancer screening. ?Talk with your health care provider about your test results, treatment options, and if necessary, the need for more tests. ?Follow these instructions at home: ?Eating and drinking ? ?Eat a diet that includes fresh fruits and vegetables, whole grains, lean protein, and low-fat dairy products. ?Take vitamin and mineral supplements as recommended by your health care provider. ?Do not drink alcohol if your health care provider tells you not to drink. ?If you drink alcohol: ?Limit how much you have to 0-2 drinks a day. ?Know how much alcohol is in your drink. In the [place] one drink equals one 12 oz bottle of beer (355 mL), one 5 oz glass of wine (148 mL), or one 1? oz glass of hard liquor (44 mL). ?Lifestyle ?Brush your teeth every morning and night with fluoride toothpaste. Floss one time each day. ?Exercise for at least 30 minutes 5 or more days each week. ?Do not use any products that contain nicotine or tobacco. These products include cigarettes, chewing tobacco, and vaping devices, such as e-cigarettes. If you need help quitting, ask your health care provider. ?Do not use drugs. ?If you are sexually active, practice safe sex. Use a condom or other form of protection to prevent STIs. ?Take aspirin only as told by your health care provider. Make sure that you understand how much to take and what form to take. Work with  your health care provider to find out whether it is safe and beneficial for you to take aspirin daily. ?Find healthy ways to manage stress, such as: ?Meditation, yoga, or listening to music. ?Journaling. ?Talking to a trusted  person. ?Spending time with friends and family. ?Minimize exposure to UV radiation to reduce your risk of skin cancer. ?Safety ?Always wear your seat belt while driving or riding in a vehicle. ?Do not drive: ?If you have been drinking alcohol. Do not ride with someone who has been drinking. ?When you are tired or distracted. ?While texting. ?If you have been using any mind-altering substances or drugs. ?Wear a helmet and other protective equipment during sports activities. ?If you have firearms in your house, make sure you follow all gun safety procedures. ?What's next? ?Go to your health care provider once a year for an annual wellness visit. ?Ask your health care provider how often you should have your eyes and teeth checked. ?Stay up to date on all vaccines. ?This information is not intended to replace advice given to you by your health care provider. Make sure you discuss any questions you have with your health care provider. ?Document Revised: 11/12/2020 Document Reviewed: 11/12/2020 ?Elsevier Patient Education ? 2022 [place]. ? ? ? ?

## 2021-09-02 NOTE — Progress Notes (Addendum)
? ?Subjective:  ? ? Patient ID: Joseph Coleman, male    DOB: [birth year], 41 y.o.   MRN: [id number] ? ?HPI ?Pt in for cpe/wellness exam. ?  ?Pt works prn as MA.   Pt does exercise regularly. Pt trying to eat healthy.nonsmoker and no alcohol. 1-2 cups of coffee a day. ? ?Vaccines appear up to diet. ? ?Pt wife wants him to get a1c. On review fh of diabetes in father. ? ? ? ?Review of Systems  ?Constitutional:  Negative for chills, fatigue and fever.  ?Respiratory:  Negative for cough, chest tightness, shortness of breath and wheezing.   ?Cardiovascular:  Negative for chest pain and palpitations.  ?Gastrointestinal:  Negative for abdominal pain.  ?Musculoskeletal:  Negative for back pain.  ?Skin:  Negative for rash.  ?Neurological:  Negative for dizziness, speech difficulty, weakness, numbness and headaches.  ?Hematological:  Negative for adenopathy. Does not bruise/bleed easily.  ?Psychiatric/Behavioral:  Negative for behavioral problems and confusion.   ? ? ?Past Medical History:  ?Diagnosis Date  ? Epigastric pain 2017  ? ?  ?Social History  ? ?Socioeconomic History  ? Marital status: Married  ?  Spouse name: Not on file  ? Number of children: 2  ? Years of education: Not on file  ? Highest education level: Not on file  ?Occupational History  ? 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 Smoking status: Never  ? Smokeless tobacco: Never  ?Vaping Use  ? Vaping Use: Never used  ?Substance and Sexual Activity  ? Alcohol use: No  ?  Alcohol/week: 0.0 standard drinks  ? Drug use: No  ? Sexual activity: Yes  ?  Partners: Female  ?Other Topics Concern  ? Not on file  ?Social History Narrative  ? Not on file  ? ?Social Determinants of Health  ? ?Financial Resource Strain: Not on file  ?Food Insecurity: Not on file  ?Transportation Needs: Not on file  ?Physical Activity: Not on file  ?Stress: Not on file  ?Social Connections: Not on file  ?Intimate Partner Violence: Not on file  ? ? ?Past Surgical History:   ?Procedure Laterality Date  ? lipoma removal    ? WISDOM TOOTH EXTRACTION  2015  ? ? ?Family History  ?Problem Relation Age of Onset  ? Hyperlipidemia Mother   ? Hypertension Mother   ? Diabetes Father   ? Alcohol abuse Sister   ? Stroke Maternal Grandfather   ? Heart attack Paternal Aunt   ? Cerebral palsy Son 8  ? Premature birth Son 59  ? Healthy Daughter 2  ? Cancer Neg Hx   ? Colon cancer Neg Hx   ? Esophageal cancer Neg Hx   ? Stomach cancer Neg Hx   ? Rectal cancer Neg Hx   ? ? ?Allergies  ?Allergen Reactions  ? Fish Allergy   ? Food [Shellfish Allergy]   ? Hydrocodone Bit-Homatrop Mbr Rash  ? ? ?No current outpatient medications on file prior to visit.  ? ?No current facility-administered medications on file prior to visit.  ? ? ?BP 114/80   Pulse 73   Temp 98.2 ?F (36.8 ?C)   Resp 18   Ht '5\' 8"'$  (1.727 m)   Wt 164 lb (74.4 kg)   SpO2 100%   BMI 24.94 kg/m?  ?  ?   ?Objective:  ? Physical Exam ? ?General ?Mental Status- Alert. General Appearance- Not in acute distress.  ? ?Skin ?General: Color- Normal Color. Moisture- Normal Moisture. ? ?  Neck ?Carotid Arteries- Normal color. Moisture- Normal Moisture. No carotid bruits. No JVD. ? ?Chest and Lung Exam ?Auscultation: ?Breath Sounds:-Normal. ? ?Cardiovascular ?Auscultation:Rythm- Regular. ?Murmurs & Other Heart Sounds:Auscultation of the heart reveals- No Murmurs. ? ?Abdomen ?Inspection:-Inspeection Normal. ?Palpation/Percussion:Note:No mass. Palpation and Percussion of the abdomen reveal- Non Tender, Non Distended + BS, no rebound or guarding. ? ? ?Neurologic ?Cranial Nerve exam:- CN III-XII intact(No nystagmus), symmetric smile. ?Strength:- 5/5 equal and symmetric strength both upper and lower extremities.  ? ? ?   ?Assessment & Plan:  ? ?Patient Instructions  ?For you wellness exam today I have ordered cbc, cmp and  lipid panel. ? ?Vaccine up to date. ? ?Recommend exercise and healthy diet. ? ?We will let you know lab results as they come in. ? ?Follow  up date appointment will be determined after lab review.   ? ?  ?Mackie Pai, PA-C  ?

## 2021-09-03 ENCOUNTER — Encounter: Payer: Self-pay | Admitting: Medical

## 2021-09-11 NOTE — Addendum Note (Signed)
Addended by: Kelle Darting A on: 09/11/2021 03:02 PM ? ? Modules accepted: Orders ? ?

## 2021-09-17 ENCOUNTER — Other Ambulatory Visit (INDEPENDENT_AMBULATORY_CARE_PROVIDER_SITE_OTHER): Payer: 59

## 2021-09-17 DIAGNOSIS — Z833 Family history of diabetes mellitus: Secondary | ICD-10-CM | POA: Diagnosis not present

## 2021-09-17 LAB — HEMOGLOBIN A1C: Hgb A1c MFr Bld: 6.3 % (ref 4.6–6.5)

## 2021-10-07 IMAGING — DX DG CHEST 1V PORT
1 series · 1 of 1 positions shown · non-contrast
Comparison: Radiographs 08/08/2014.

CLINICAL DATA: Cough with fever. Clinical concern for F2FH4-DF
infection.

EXAM:
PORTABLE CHEST 1 VIEW

[chest ap]
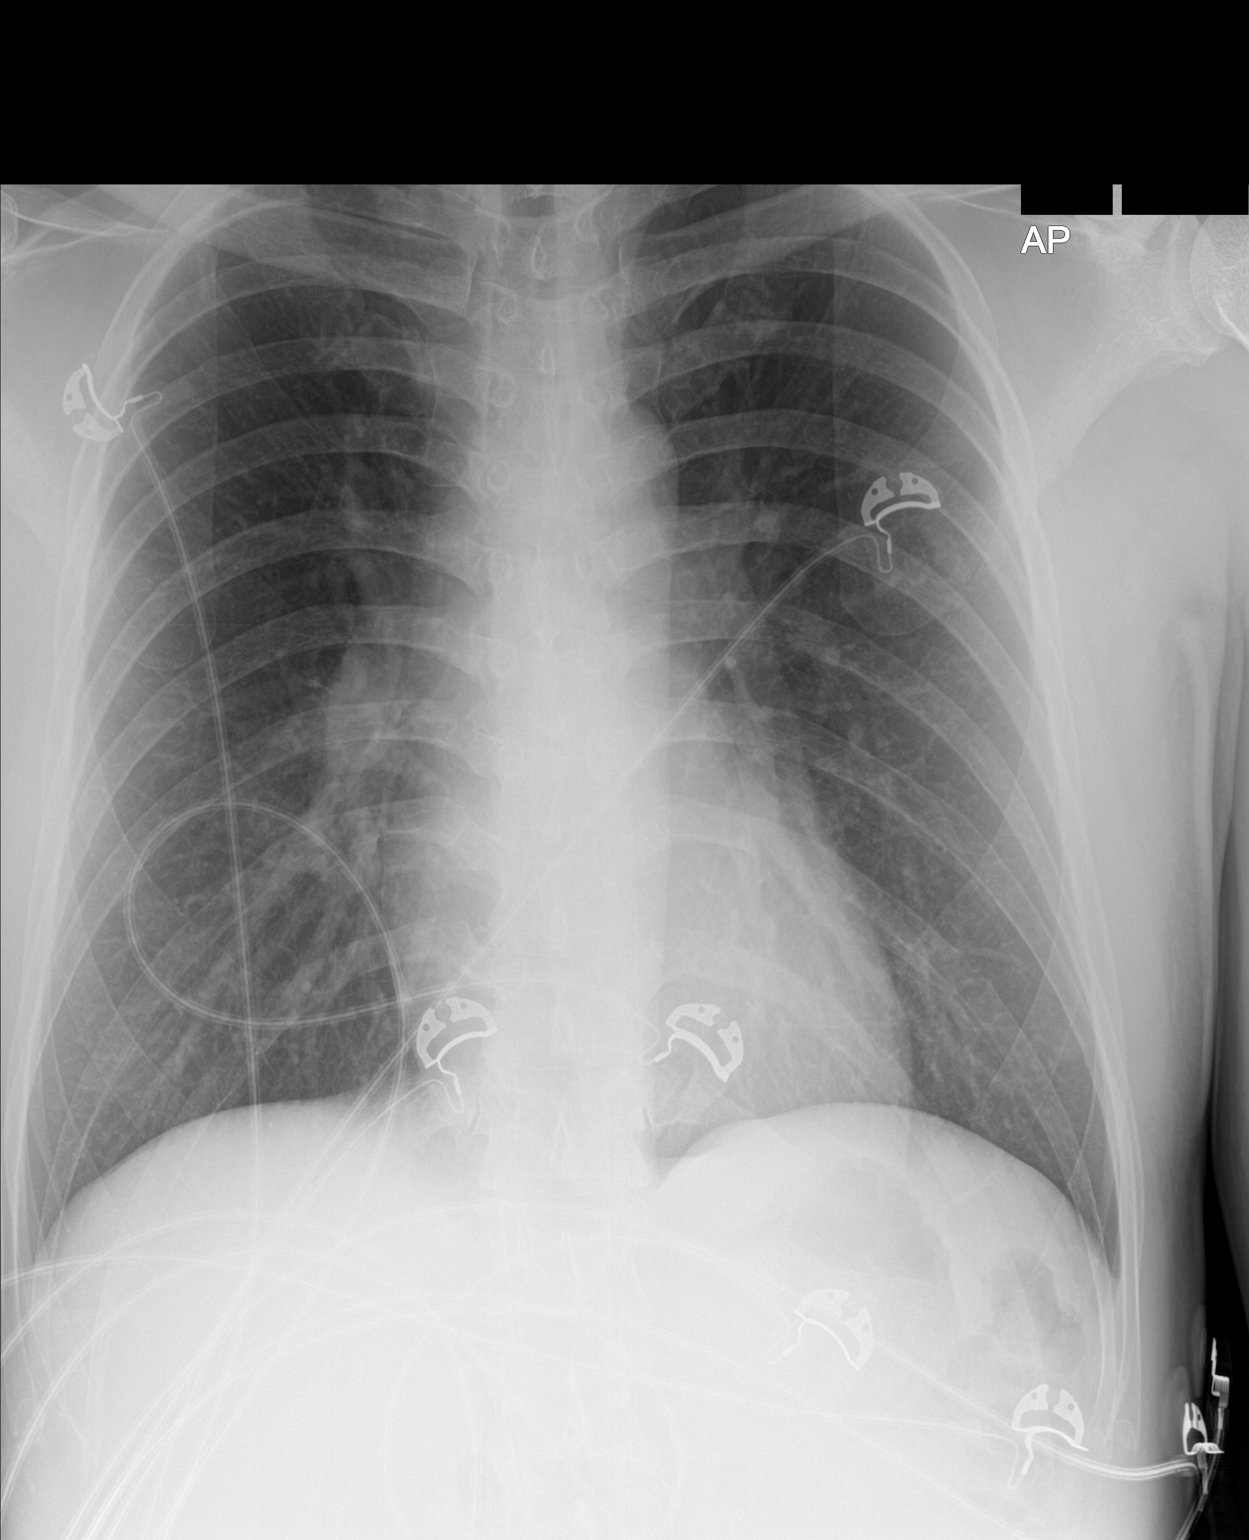

[1 of 1 positions shown; findings below may reference images not displayed]

FINDINGS: 4964 hours. The heart size and mediastinal contours are normal. The
lungs are clear. There is no pleural effusion or pneumothorax. No
acute osseous findings are identified. Telemetry leads overlie the
chest.
IMPRESSION: Stable chest. No radiographic evidence of active cardiopulmonary
process.

## 2021-10-14 IMAGING — DX DG CHEST 1V PORT
1 series · 1 of 1 positions shown · non-contrast
Comparison: 03/08/2020

CLINICAL DATA: Diagnosed with COVID pneumonia on 03/08/2020 with
persistent cough shortness of breath and dizziness. Now with
hemoptysis.

EXAM:
PORTABLE CHEST 1 VIEW

[chest ap]
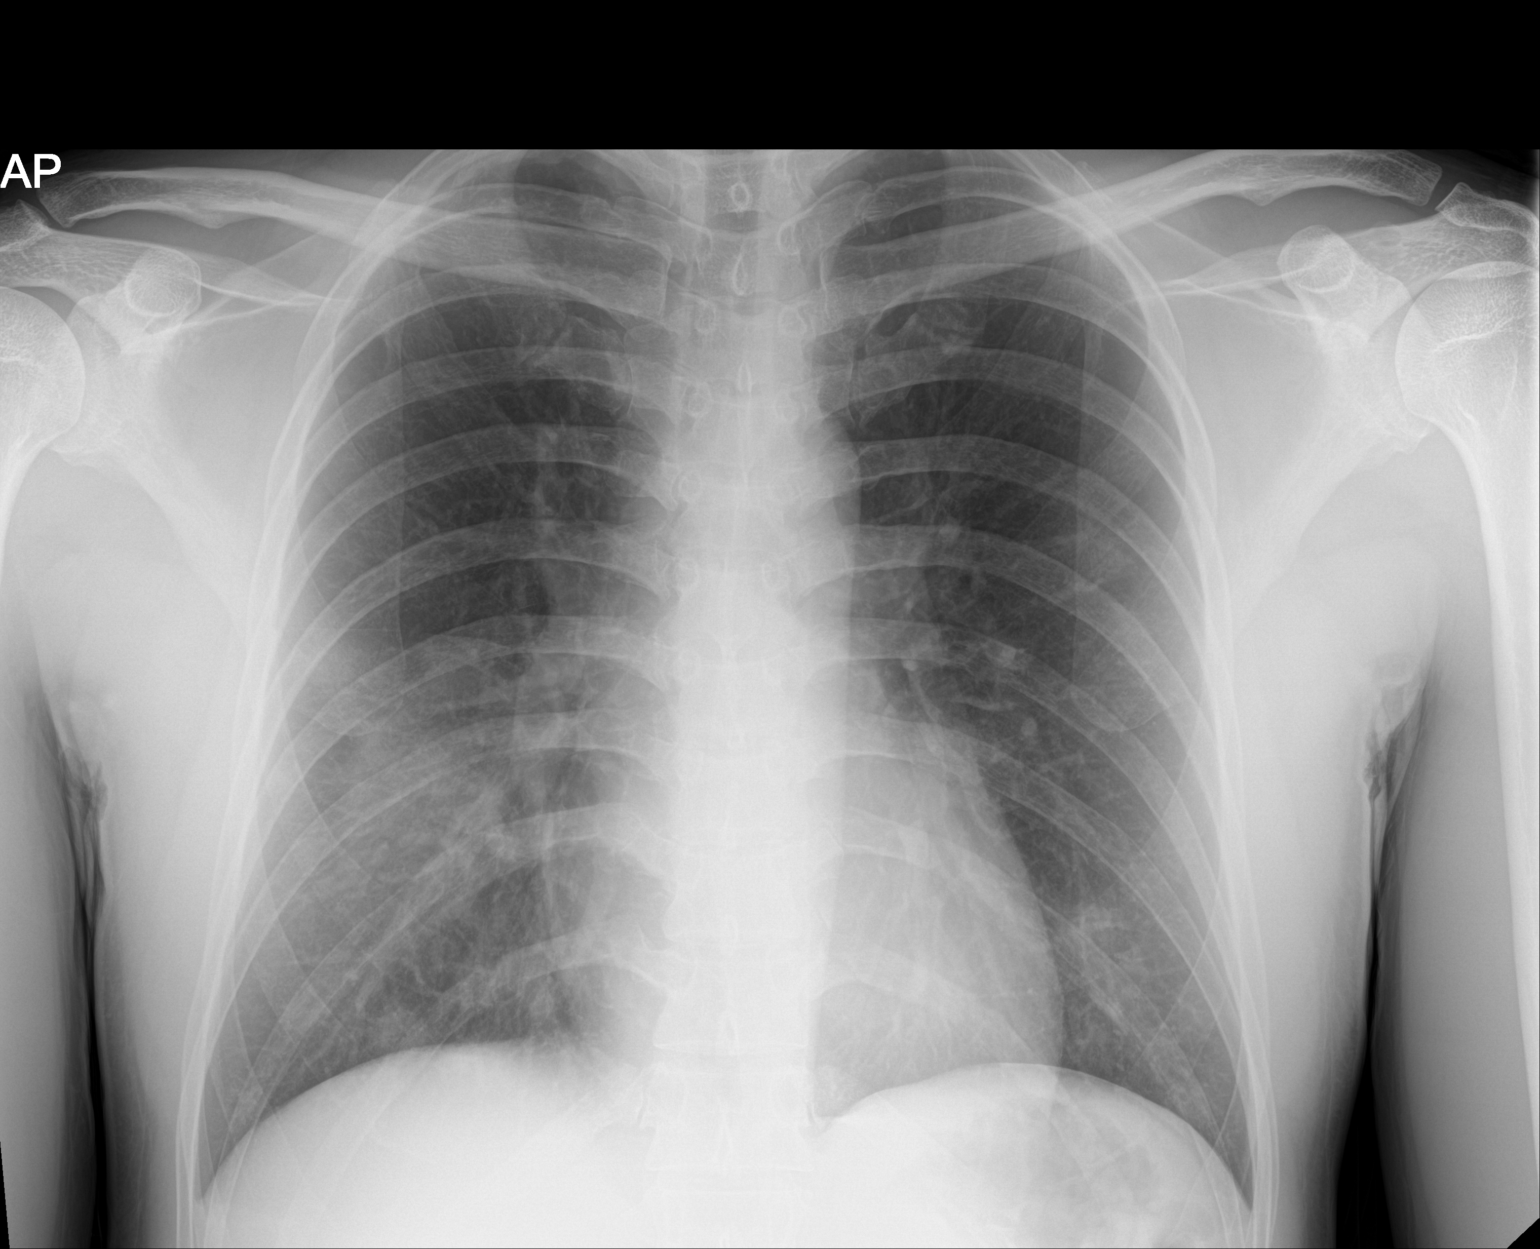

[1 of 1 positions shown; findings below may reference images not displayed]

FINDINGS: Grossly unchanged cardiac silhouette and mediastinal contours.
Interval development bilateral nodular airspace opacities, right
greater than left, worrisome for multifocal pneumonia. No pleural
effusion or pneumothorax. No evidence of edema. No acute osseous
abnormalities.
IMPRESSION: Bilateral nodular airspace opacities, right greater than left,
worrisome for multifocal K34EN-18 pneumonia.

## 2021-10-30 ENCOUNTER — Ambulatory Visit (INDEPENDENT_AMBULATORY_CARE_PROVIDER_SITE_OTHER): Payer: 59 | Admitting: Gastroenterology

## 2021-10-30 ENCOUNTER — Encounter: Payer: Self-pay | Admitting: Gastroenterology

## 2021-10-30 VITALS — BP 104/74 | HR 61 | Ht 68.0 in | Wt 161.0 lb

## 2021-10-30 DIAGNOSIS — R1032 Left lower quadrant pain: Secondary | ICD-10-CM

## 2021-10-30 MED ORDER — CLENPIQ 10-3.5-12 MG-GM -GM/175ML PO SOLN: 1.0000 | Freq: Once | ORAL | 0 refills | Status: AC

## 2021-10-30 NOTE — Patient Instructions (Addendum)
If you are age 42 or younger, your body mass index should be between 19-25. Your Body mass index is 24.48 kg/m. If this is out of the aformentioned range listed, please consider follow up with your Primary Care Provider.   ________________________________________________________  The [facility] providers would like to encourage you to use [facility] to communicate with providers for non-urgent requests or questions.  Due to long hold times on the telephone, sending your provider a message by [facility] may be a faster and more efficient way to get a response.  Please allow 48 business hours for a response.  Please remember that this is for non-urgent requests.  _______________________________________________________  Due to recent changes in healthcare laws, you may see the results of your imaging and laboratory studies on MyChart before your provider has had a chance to review them.  We understand that in some cases there may be results that are confusing or concerning to you. Not all laboratory results come back in the same time frame and the provider may be waiting for multiple results in order to interpret others.  Please give [place] 48 hours in order for your provider to thoroughly review all the results before contacting the office for clarification of your results.   We have sent the following medications to your pharmacy for you to pick up at your convenience: [email or web address]  You have been scheduled for a colonoscopy. Please follow written instructions given to you at your visit today.  Please pick up your prep supplies at the pharmacy within the next 1-3 days. If you use inhalers (even only as needed), please bring them with you on the day of your procedure.    Thank you for choosing me and [facility].  Vito Cirigliano, D.O.

## 2021-10-30 NOTE — Progress Notes (Signed)
Chief Complaint:    LLQ pain  GI History: 41 year old male with intermittent LUQ/MEG pain since late 2020.  Symptoms occur intermittently described as stabbing pain.  Can last 1 minute to a few hours. No associated n/v/f/c/d/c, melena, hematochezia. No radiation. Sxs worse with coffee (was drinking 6 cups/day), soda (drank rarely), which he has since stopped.  -07/07/2020: Initial GI evaluation - 07/07/2020: H/H 13.1/41.4, MCV/RDW 66.3/15 (stable, chronic).  Normal iron panel - 08/18/2020: EGD: Normal - 09/02/2021: H/H 13.1/41.3, MCV/RDW 66.5/14.9.  Normal CMP. A1c 6.3%   HPI:     Patient is a 41 y.o. male presenting to the Gastroenterology Clinic for evaluation of LLQ pain.  Was initially seen by me on 07/07/2020 for evaluation of LUQ/epigastric pain with subsequent EGD on 08/18/2020 as outlined above.  No follow-up since then.  He describes LLQ pain.  This is different in location and quality from the previous abdominal pain he was evaluated for.  Described as constant, dull pain. Started in 2019 and has been intermittent since then. Worse with certain foods ("junk food", candy, caffeine). Has since stopped all caffeine. Can occur within 5 mins of PO intake, lasting a few hours.  Symptoms not too reproducible with diet.  Will have a dull ache most days, but sharp pain is now 1-2 times/month since improving his diet. No associated change in bowel habits, fever, chills. Weight stable. No night sweats.  Not associated with certain movements, flexion, etc.  Fhx n/f father with DM. Mother with Lymphoma. No Fhx of GI malignancy, IBD.   He presents to the office today with his wife.  Review of systems:     No chest pain, no SOB, no fevers, no urinary sx   Past Medical History:  Diagnosis Date   Epigastric pain 2017   Prediabetes     Patient's surgical history, family medical history, social history, medications and allergies were all reviewed in Epic    Current Outpatient Medications   Medication Sig Dispense Refill   Multiple Vitamin (MULTIVITAMIN) tablet Take 1 tablet by mouth as needed.     No current facility-administered medications for this visit.    Physical Exam:     BP 104/74   Pulse 61   Ht '5\' 8"'$  (1.727 m)   Wt 161 lb (73 kg)   BMI 24.48 kg/m   GENERAL:  Pleasant male in NAD PSYCH: : Cooperative, normal affect EENT:  conjunctiva pink, mucous membranes moist, neck supple without masses ABDOMEN: Mild TTP in LLQ.  Equivocal Carnett's sign (better with abdominal crunch but worse with left leg flexion).  Abdomen otherwise nondistended, soft. No obvious masses, no hepatomegaly,  normal bowel sounds SKIN:  turgor, no lesions seen Musculoskeletal:  Normal muscle tone, normal strength NEURO: Alert and oriented x 3, no focal neurologic deficits   IMPRESSION and PLAN:    1) LLQ pain - Discussed DDx to include GI etiology vs MSK etiology.  Patient has heightened concerns about pain, particular GI pathology - Colonoscopy to evaluate for mucosal/luminal pathology - If colonoscopy unrevealing and symptoms persist, plan for CT abdomen/pelvis  The indications, risks, and benefits of colonoscopy were explained to the patient in detail. Risks include but are not limited to bleeding, perforation, adverse reaction to medications, and cardiopulmonary compromise. Sequelae include but are not limited to the possibility of surgery, hospitalization, and mortality. The patient verbalized understanding and wished to proceed. All questions answered, referred to the scheduler and bowel prep ordered. Further recommendations pending results of the exam.  Lavena Bullion ,DO, FACG 10/30/2021, 9:47 AM

## 2021-11-10 ENCOUNTER — Encounter: Payer: Self-pay | Admitting: Gastroenterology

## 2021-11-17 ENCOUNTER — Ambulatory Visit (AMBULATORY_SURGERY_CENTER): Payer: 59 | Admitting: Gastroenterology

## 2021-11-17 ENCOUNTER — Encounter: Payer: Self-pay | Admitting: Gastroenterology

## 2021-11-17 VITALS — BP 113/77 | HR 64 | Temp 98.0°F | Resp 13 | Ht 68.0 in | Wt 161.0 lb

## 2021-11-17 DIAGNOSIS — R1032 Left lower quadrant pain: Secondary | ICD-10-CM | POA: Diagnosis present

## 2021-11-17 DIAGNOSIS — K640 First degree hemorrhoids: Secondary | ICD-10-CM

## 2021-11-17 MED ORDER — SODIUM CHLORIDE 0.9 % IV SOLN: 500.0000 mL | Freq: Once | INTRAVENOUS | Status: DC

## 2021-11-17 NOTE — Progress Notes (Signed)
GASTROENTEROLOGY PROCEDURE H&P NOTE   Primary Care Physician: Mackie Pai, PA-C    Reason for Procedure:   LLQ pain  Plan:    Colonoscopy  Patient is appropriate for endoscopic procedure(s) in the ambulatory ([place]) setting.  The nature of the procedure, as well as the risks, benefits, and alternatives were carefully and thoroughly reviewed with the patient. Ample time for discussion and questions allowed. The patient understood, was satisfied, and agreed to proceed.     HPI: Joseph Coleman is a 41 y.o. male who presents for colonoscopy for evaluation of LLQ pain .  Patient was most recently seen in the Gastroenterology Clinic on 10/30/2021 by me.  No interval change in medical history since that appointment. Please refer to that note for full details regarding GI history and clinical presentation.   Past Medical History:  Diagnosis Date   Epigastric pain 2017   Prediabetes     Past Surgical History:  Procedure Laterality Date   lipoma removal     WISDOM TOOTH EXTRACTION  2015    Prior to Admission medications   Medication Sig Start Date End Date Taking? Authorizing Provider  Multiple Vitamin (MULTIVITAMIN) tablet Take 1 tablet by mouth as needed.   Yes [provider]    Current Outpatient Medications  Medication Sig Dispense Refill   Multiple Vitamin (MULTIVITAMIN) tablet Take 1 tablet by mouth as needed.     Current Facility-Administered Medications  Medication Dose Route Frequency Provider Last Rate Last Admin   0.9 %  sodium chloride infusion  500 mL Intravenous Once Rosary Filosa V, DO        Allergies as of 11/17/2021 - Review Complete 11/17/2021  Allergen Reaction Noted   Fish allergy  03/10/2020   Food [shellfish allergy]  03/10/2020   Hydrocodone bit-homatrop mbr Rash 06/10/2018    Family History  Problem Relation Age of Onset   Hyperlipidemia Mother    Hypertension Mother    Diabetes Father    Alcohol abuse Sister    Stroke  Maternal Grandfather    Heart attack Paternal Aunt    Cerebral palsy Son 8   Premature birth Son 69   Healthy Daughter 2   Cancer Neg Hx    Colon cancer Neg Hx    Esophageal cancer Neg Hx    Stomach cancer Neg Hx    Rectal cancer Neg Hx     Social History   Socioeconomic History   Marital status: Married    Spouse name: Not on file   Number of children: 2   Years of education: Not on file   Highest education level: Not on file  Occupational History   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  Tobacco Use   Smoking status: Former    Types: Cigarettes   Smokeless tobacco: Never  Vaping Use   Vaping Use: Never used  Substance and Sexual Activity   Alcohol use: No    Alcohol/week: 0.0 standard drinks of alcohol   Drug use: No   Sexual activity: Yes    Partners: Female  Other Topics Concern   Not on file  Social History Narrative   Not on file   Social Determinants of Health   Financial Resource Strain: Not on file  Food Insecurity: Not on file  Transportation Needs: Not on file  Physical Activity: Not on file  Stress: Not on file  Social Connections: Not on file  Intimate Partner Violence: Not on file    Physical Exam: Vital signs in  last 24 hours: '@BP'$  115/73   Pulse 62   Temp 98 F (36.7 C)   Ht '5\' 8"'$  (1.727 m)   Wt 161 lb (73 kg)   SpO2 99%   BMI 24.48 kg/m  GEN: NAD EYE: Sclerae anicteric ENT: MMM CV: Non-tachycardic Pulm: CTA b/l GI: Soft, NT/ND NEURO:  Alert & Oriented x 3   Gerrit Heck, DO [facility]   11/17/2021 9:11 AM

## 2021-11-17 NOTE — Progress Notes (Signed)
Pt's states no medical or surgical changes since previsit or office visit. 

## 2021-11-17 NOTE — Patient Instructions (Signed)
Use fiber, such as Citrucel,FiberCon, Konsyl,or Metamucil   Handout on Hemorrhoids given to you today    YOU HAD AN ENDOSCOPIC PROCEDURE TODAY AT [facility]:   Refer to the procedure report that was given to you for any specific questions about what was found during the examination.  If the procedure report does not answer your questions, please call your gastroenterologist to clarify.  If you requested that your care partner not be given the details of your procedure findings, then the procedure report has been included in a sealed envelope for you to review at your convenience later.  YOU SHOULD EXPECT: Some feelings of bloating in the abdomen. Passage of more gas than usual.  Walking can help get rid of the air that was put into your GI tract during the procedure and reduce the bloating. If you had a lower endoscopy (such as a colonoscopy or flexible sigmoidoscopy) you may notice spotting of blood in your stool or on the toilet paper. If you underwent a bowel prep for your procedure, you may not have a normal bowel movement for a few days.  Please Note:  You might notice some irritation and congestion in your nose or some drainage.  This is from the oxygen used during your procedure.  There is no need for concern and it should clear up in a day or so.  SYMPTOMS TO REPORT IMMEDIATELY:  Following lower endoscopy (colonoscopy or flexible sigmoidoscopy):  Excessive amounts of blood in the stool  Significant tenderness or worsening of abdominal pains  Swelling of the abdomen that is new, acute  Fever of 100F or higher   For urgent or emergent issues, a gastroenterologist can be reached at any hour by calling [phone]. Do not use MyChart messaging for urgent concerns.    DIET:  We do recommend a small meal at first, but then you may proceed to your regular diet.  Drink plenty of fluids but you should avoid alcoholic beverages for 24 hours.  ACTIVITY:  You should  plan to take it easy for the rest of today and you should NOT DRIVE or use heavy machinery until tomorrow (because of the sedation medicines used during the test).    FOLLOW UP: Our staff will call the number listed on your records 24-72 hours following your procedure to check on you and address any questions or concerns that you may have regarding the information given to you following your procedure. If we do not reach you, we will leave a message.  We will attempt to reach you two times.  During this call, we will ask if you have developed any symptoms of COVID 19. If you develop any symptoms (ie: fever, flu-like symptoms, shortness of breath, cough etc.) before then, please call [phone].  If you test positive for Covid 19 in the 2 weeks post procedure, please call and report this information to [place].    If any biopsies were taken you will be contacted by phone or by letter within the next 1-3 weeks.  Please call us at [phone] if you have not heard about the biopsies in 3 weeks.    SIGNATURES/CONFIDENTIALITY: You and/or your care partner have signed paperwork which will be entered into your electronic medical record.  These signatures attest to the fact that that the information above on your After Visit Summary has been reviewed and is understood.  Full responsibility of the confidentiality of this discharge information lies with you and/or your  care-partner.  

## 2021-11-17 NOTE — Progress Notes (Signed)
Sedate, gd SR, tolerated procedure well, VSS, report to RN 

## 2021-11-17 NOTE — Op Note (Signed)
[place] Patient Name: Joseph Coleman Procedure Date: 11/17/2021 9:12 AM MRN: [id number] Endoscopist: Gerrit Heck , MD Age: 41 Referring MD:  Date of Birth: [birth year] Gender: Male Account #: [id number] Procedure:                Colonoscopy Indications:              Abdominal pain in the left lower quadrant Medicines:                Monitored Anesthesia Care Procedure:                Pre-Anesthesia Assessment:                           - Prior to the procedure, a History and Physical                            was performed, and patient medications and                            allergies were reviewed. The patient's tolerance of                            previous anesthesia was also reviewed. The risks                            and benefits of the procedure and the sedation                            options and risks were discussed with the patient.                            All questions were answered, and informed consent                            was obtained. Prior Anticoagulants: The patient has                            taken no previous anticoagulant or antiplatelet                            agents. ASA Grade Assessment: I - A normal, healthy                            patient. After reviewing the risks and benefits,                            the patient was deemed in satisfactory condition to                            undergo the procedure.                           After obtaining informed consent, the colonoscope  was passed under direct vision. Throughout the                            procedure, the patient's blood pressure, pulse, and                            oxygen saturations were monitored continuously. The                            Olympus PCF-H190DL (#[id number]) Colonoscope was                            introduced through the anus and advanced to the the                            terminal ileum. The colonoscopy  was performed                            without difficulty. The patient tolerated the                            procedure well. The quality of the bowel                            preparation was good. The terminal ileum, ileocecal                            valve, appendiceal orifice, and rectum were                            photographed. Scope In: 9:21:14 AM Scope Out: 9:36:31 AM Scope Withdrawal Time: 0 hours 11 minutes 49 seconds  Total Procedure Duration: 0 hours 15 minutes 17 seconds  Findings:                 The perianal and digital rectal examinations were                            normal.                           The entire colon appeared normal. No areas of                            mucosal erythema, edema, erosions, or ulceration.                           Non-bleeding internal hemorrhoids were found during                            retroflexion. The hemorrhoids were small and Grade                            I (internal hemorrhoids that do not prolapse).  The terminal ileum appeared normal. Complications:            No immediate complications. Estimated Blood Loss:     Estimated blood loss: none. Impression:               - The entire examined colon is normal. No mucosal                            or luminal etiology for presenting symptoms on this                            study.                           - Non-bleeding internal hemorrhoids.                           - The examined portion of the ileum was normal.                           - No specimens collected. Recommendation:           - Patient has a contact number available for                            emergencies. The signs and symptoms of potential                            delayed complications were discussed with the                            patient. Return to normal activities tomorrow.                            Written discharge instructions were provided to the                             patient.                           - Resume previous diet.                           - Continue present medications.                           - Repeat colonoscopy in 10 years for screening                            purposes.                           - Use fiber, for example Citrucel, Fibercon, Konsyl                            or Metamucil.                           -  Return to GI clinic PRN.                           - If symptoms persist, can consider CT                            abdomen/pelvis with contrast. Gerrit Heck, MD 11/17/2021 9:42:25 AM

## 2021-11-18 ENCOUNTER — Telehealth: Payer: Self-pay | Admitting: *Deleted

## 2021-11-18 NOTE — Telephone Encounter (Signed)
  Follow up Call-     11/17/2021    8:17 AM 08/18/2020   10:27 AM  Call back number  Post procedure Call Back phone  # [phone]  Permission to leave phone message Yes Yes     Patient questions:  Do you have a fever, pain , or abdominal swelling? No. Pain Score  0 *  Have you tolerated food without any problems? Yes.    Have you been able to return to your normal activities? Yes.    Do you have any questions about your discharge instructions: Diet   No. Medications  No. Follow up visit  No.  Do you have questions or concerns about your Care? No.  Actions: * If pain score is 4 or above: No action needed, pain <4.

## 2021-12-11 IMAGING — US US SCROTUM W/ DOPPLER COMPLETE
2 series · 13 of 25 positions shown · non-contrast
Comparison: None.

CLINICAL DATA: Right testicle pain with lump

EXAM:
SCROTAL ULTRASOUND
DOPPLER ULTRASOUND OF THE TESTICLES
TECHNIQUE: Complete ultrasound examination of the testicles, epididymis, and
other scrotal structures was performed. Color and spectral Doppler
ultrasound were also utilized to evaluate blood flow to the
testicles.

[Series 1: us scrotum w/ doppler complete · 38 acquisitions, 12 frames shown (1 of 2)]
[im 1/38]
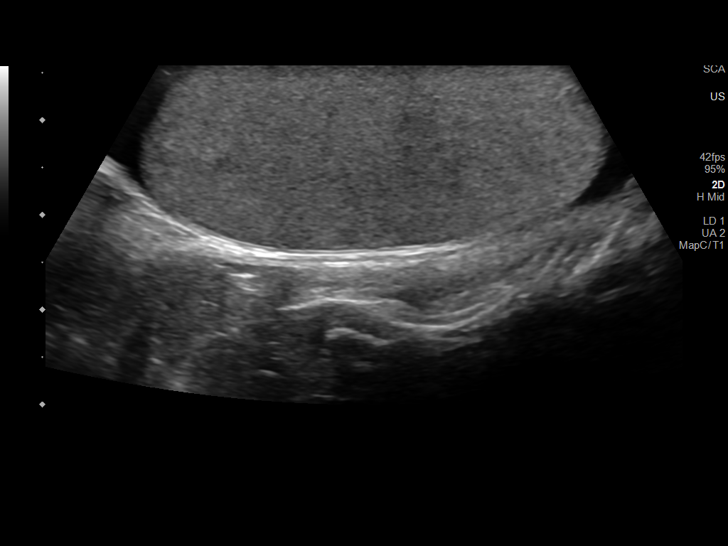
[im 4/38]
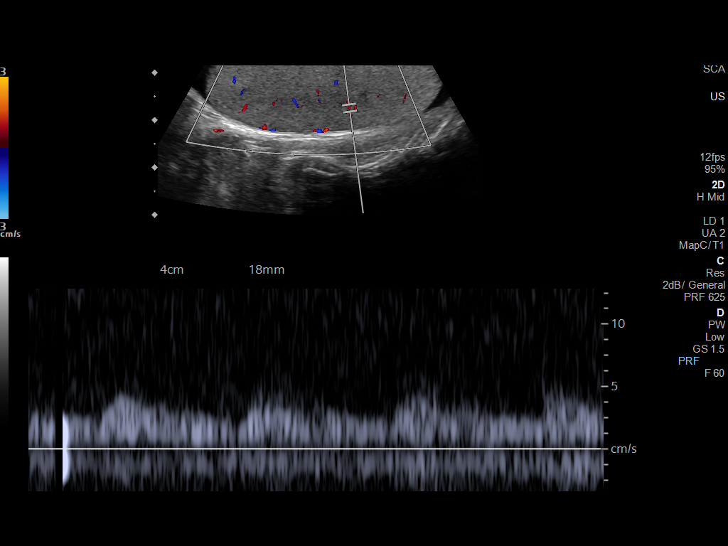
[im 7/38]
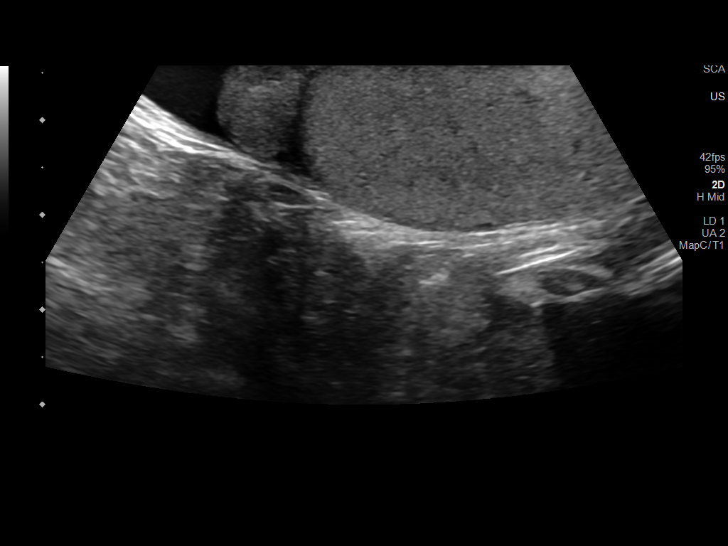
[im 10/38]
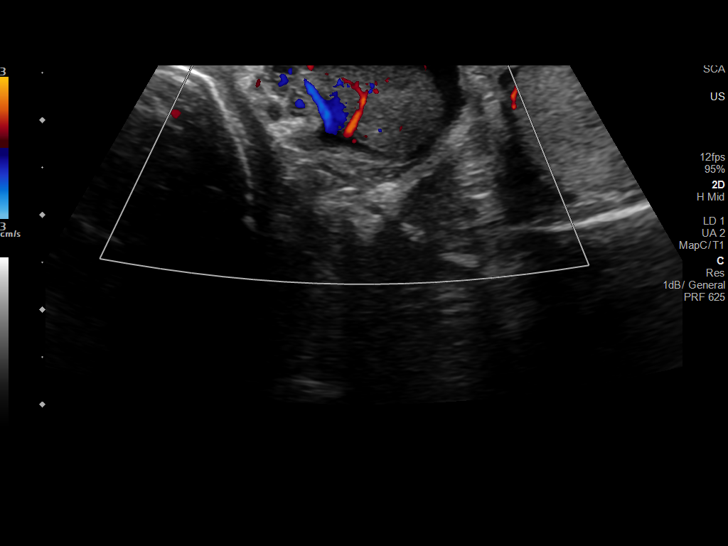
[im 13/38]
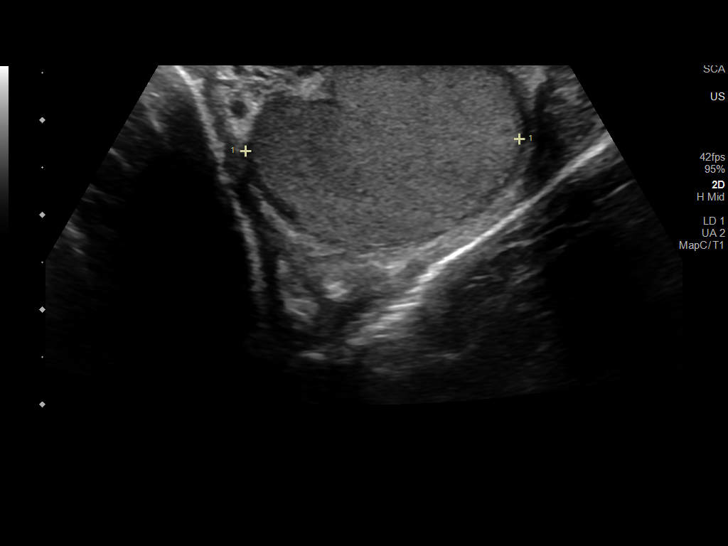
[im 17/38]
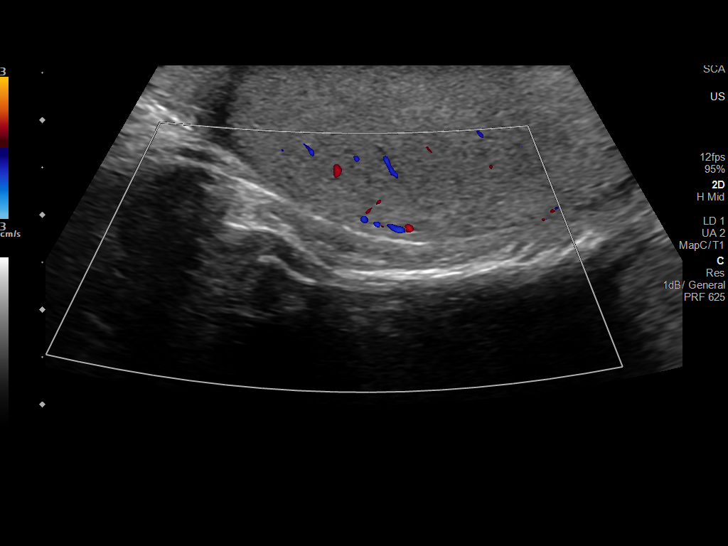
[im 20/38]
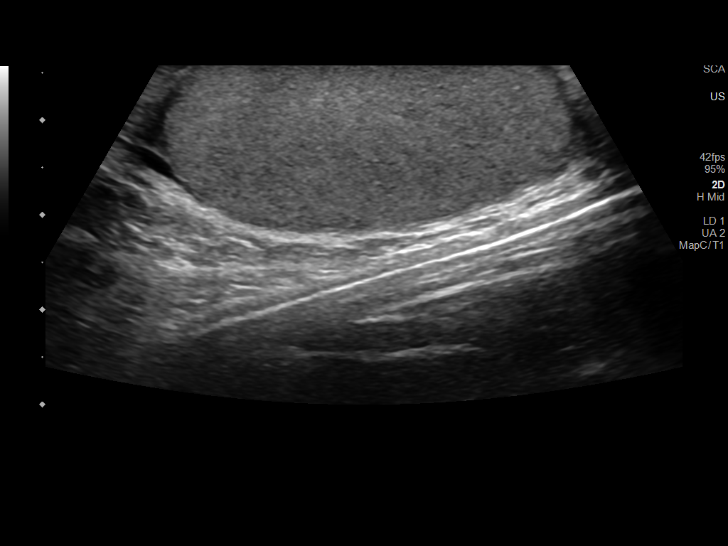
[im 23/38]
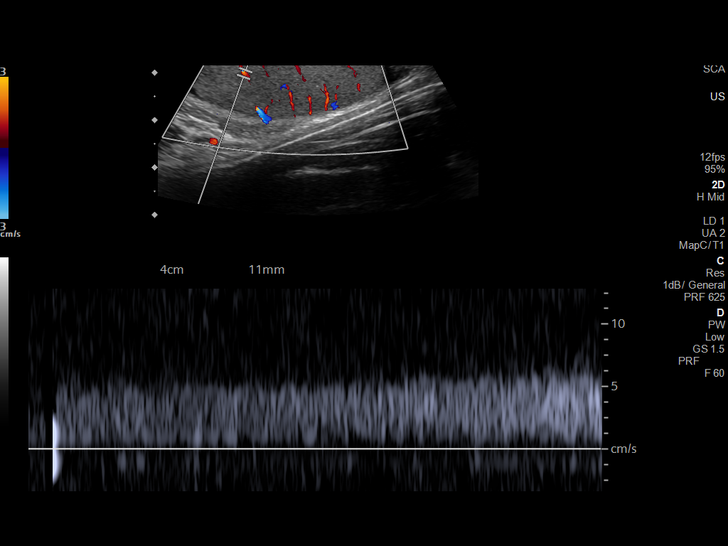
[im 26/38]
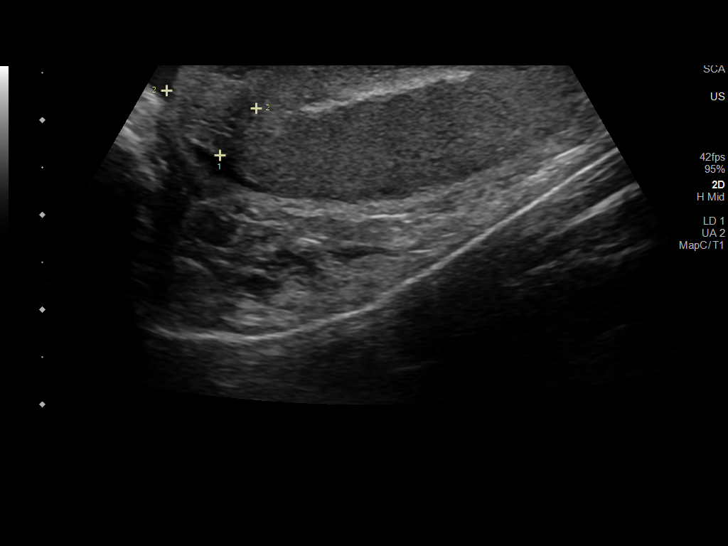
[im 29/38]
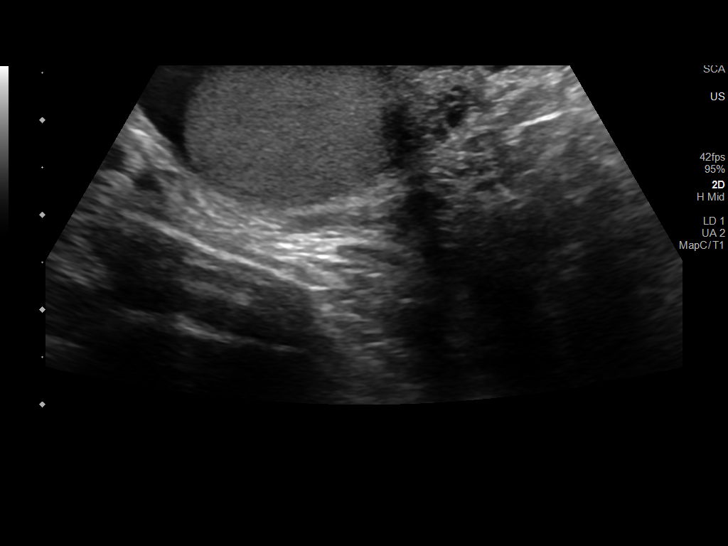
[im 33/38]
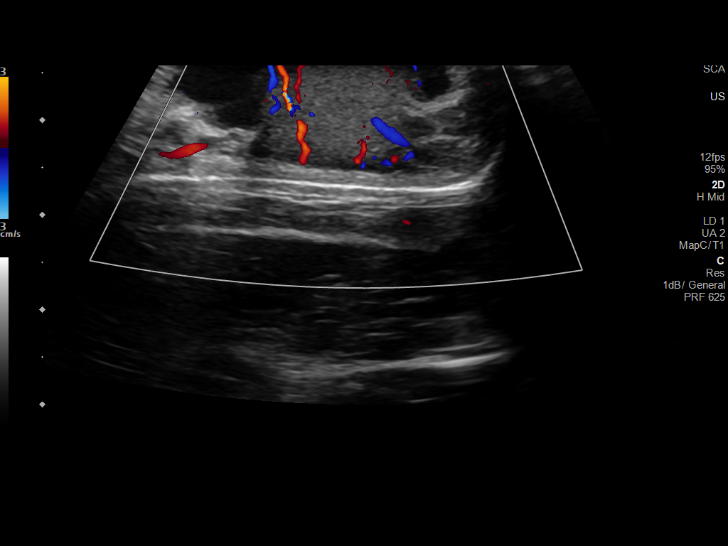
[im 36/38]
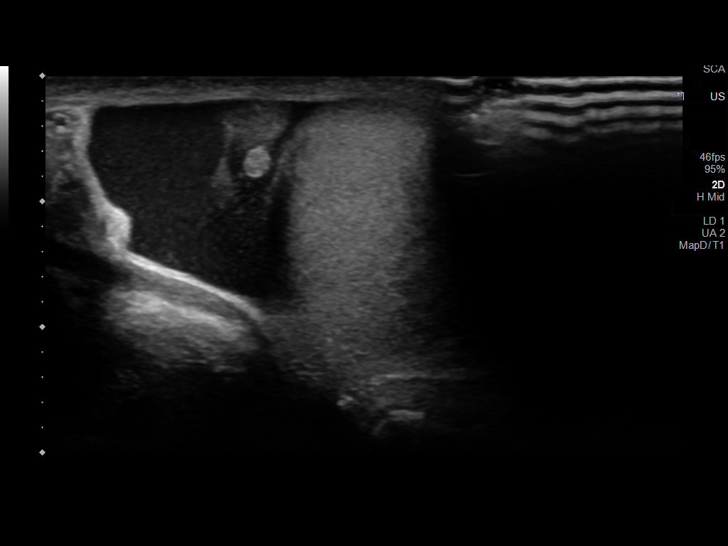

[Series 2: us scrotum w/ doppler complete · 1 of 2 slices shown (2 of 2)]
[im 1/2]
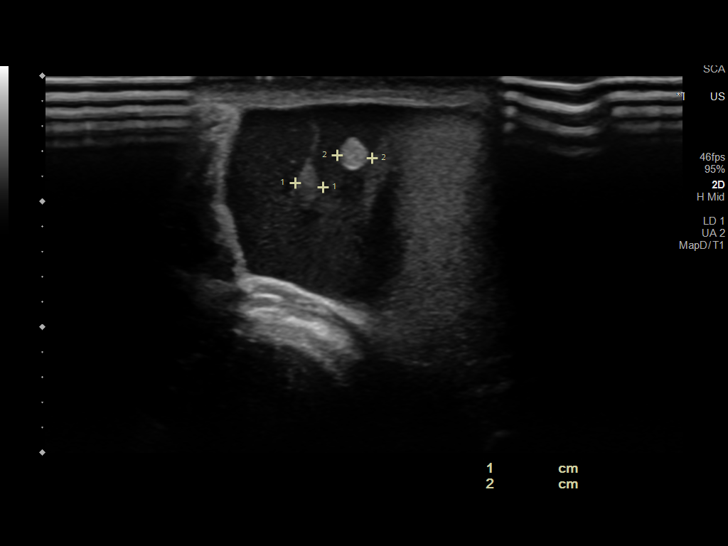

[13 of 25 positions shown; findings below may reference images not displayed]

FINDINGS: Right testicle

Measurements: 4.9 x 2.1 x 2.9 cm. No mass or microlithiasis
visualized.

Left testicle

Measurements: 4.4 x 1.9 x 3.1 cm. No mass or microlithiasis
visualized.

Right epididymis: Normal in size and appearance. There are 2
exophytic structures arising off the right epididymis consistent
with benign epididymal appendages. The largest measures the largest
measures 0.3 x 0.2 x 0.3 cm and appears echogenic.

Left epididymis:  Normal in size and appearance.

Hydrocele: Small to moderate mildly complex right hydrocele
containing diffuse internal echoes.

Varicocele:  None visualized.

Pulsed Doppler interrogation of both testes demonstrates normal low
resistance arterial and venous waveforms bilaterally.
IMPRESSION: 1. No evidence for testicular mass or torsion.
2. There are 2 exophytic structures arising off the right epididymis
compatible with benign epididymal appendages.
3. Small to moderate, mildly complex right hydrocele.

## 2022-02-14 ENCOUNTER — Encounter: Payer: Self-pay | Admitting: Gastroenterology

## 2022-02-15 ENCOUNTER — Other Ambulatory Visit: Payer: Self-pay

## 2022-02-15 DIAGNOSIS — R1032 Left lower quadrant pain: Secondary | ICD-10-CM

## 2022-02-15 NOTE — Telephone Encounter (Signed)
Please order CT abd/pelvis with contrast. Indication: LLQ pain.   Thanks.

## 2022-02-26 ENCOUNTER — Ambulatory Visit (HOSPITAL_COMMUNITY)
Admission: RE | Admit: 2022-02-26 | Discharge: 2022-02-26 | Disposition: A | Discharge status: Home / Self Care | Payer: 59 | Source: Ambulatory Visit | Attending: Gastroenterology | Admitting: Gastroenterology

## 2022-02-26 DIAGNOSIS — R1032 Left lower quadrant pain: Secondary | ICD-10-CM | POA: Diagnosis not present

## 2022-02-26 MED ORDER — SODIUM CHLORIDE (PF) 0.9 % IJ SOLN
INTRAMUSCULAR | Status: AC
  Filled 2022-02-26: qty 50

## 2022-02-26 MED ORDER — IOHEXOL 300 MG/ML  SOLN
100.0000 mL | Freq: Once | INTRAMUSCULAR | Status: AC | PRN
  Administered 2022-02-26: 100 mL via INTRAVENOUS

## 2022-05-13 ENCOUNTER — Ambulatory Visit: Payer: 59 | Admitting: Medical

## 2022-05-13 VITALS — BP 130/78 | HR 86 | Temp 97.8°F | Resp 18 | Ht 68.0 in | Wt 160.0 lb

## 2022-05-13 DIAGNOSIS — M542 Cervicalgia: Secondary | ICD-10-CM

## 2022-05-13 MED ORDER — CYCLOBENZAPRINE HCL 10 MG PO TABS: 10.0000 mg | ORAL_TABLET | Freq: Every day | ORAL | 0 refills | Status: DC

## 2022-05-13 MED ORDER — DICLOFENAC SODIUM 75 MG PO TBEC: 75.0000 mg | DELAYED_RELEASE_TABLET | Freq: Two times a day (BID) | ORAL | 0 refills | Status: DC

## 2022-05-13 NOTE — Patient Instructions (Addendum)
Left side neck pain low level for one month. No carotid bruit. Pain described in area of sternocleidomastoid. Probable muscle strain. Recommend prescribing diclofenac 75 mg twice daily and rx flexeril to use at night.  Will see how you do. If pain persist consider referral to sport medicine MD and/or imaging studies.  If signs/symptoms worsen or change let us know  Follow up 2 weeks or sooner if needed.

## 2022-05-13 NOTE — Progress Notes (Signed)
Subjective:    Patient ID: Joseph Coleman, male    DOB: [birth year], 41 y.o.   MRN: [id number]  HPI  Pt in with low level neck pain for about one month. Pain came on randomly one night. Pt describes pain has been on left side of his neck. Pt states actually had spasm only on time at onset of pain. Now only had very light level of soreness. No ha. No rash.  Has been doing a lot of work around the house. Preparing for sale.  Pt has not taken anything for signs/symptoms other than tylenol.    Review of Systems  Constitutional:  Negative for chills, fatigue and fever.  Respiratory:  Negative for cough, chest tightness, shortness of breath and wheezing.   Cardiovascular:  Negative for chest pain and palpitations.  Gastrointestinal:  Negative for abdominal pain.  Musculoskeletal:  Positive for neck pain.  Psychiatric/Behavioral:  Negative for behavioral problems and confusion.     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  Tobacco Use   Smoking status: Former    Types: Cigarettes   Smokeless tobacco: Never  Vaping Use   Vaping Use: Never used  Substance and Sexual Activity   Alcohol use: No    Alcohol/week: 0.0 standard drinks of alcohol   Drug use: No   Sexual activity: Yes    Partners: Female  Other Topics Concern   Not on file  Social History Narrative   Not on file   Social Determinants of Health   Financial Resource Strain: Not on file  Food Insecurity: Not on file  Transportation Needs: Not on file  Physical Activity: Not on file  Stress: Not on file  Social Connections: Not on file  Intimate Partner Violence: Not on file    Past Surgical History:  Procedure  Laterality Date   lipoma removal     WISDOM TOOTH EXTRACTION  2015    Family History  Problem Relation Age of Onset   Hyperlipidemia Mother    Hypertension Mother    Diabetes Father    Alcohol abuse Sister    Stroke Maternal Grandfather    Heart attack Paternal Aunt    Cerebral palsy Son 8   Premature birth Son 31   Healthy Daughter 2   Cancer Neg Hx    Colon cancer Neg Hx    Esophageal cancer Neg Hx    Stomach cancer Neg Hx    Rectal cancer Neg Hx     Allergies  Allergen Reactions   Fish Allergy    Food [Shellfish Allergy]    Hydrocodone Bit-Homatrop Mbr Rash    Current Outpatient Medications on File Prior to Visit  Medication Sig Dispense Refill   Multiple Vitamin (MULTIVITAMIN) tablet Take 1 tablet by mouth as needed.     No current facility-administered medications on file prior to visit.    BP 130/78   Pulse 86   Temp 97.8 F (36.6 C)   Resp 18   Ht '5\' 8"'$  (1.727 m)   Wt 160 lb (72.6 kg)   SpO2 99%   BMI 24.33 kg/m  Objective:   Physical Exam  General Mental Status- Alert. General Appearance- Not in acute distress.   Skin General: Color- Normal Color. Moisture- Normal Moisture.  Neck No carotid bruit, no redness, no warmth.  On range of motion no pain.  On faint tenderness to palpation at base of sternocleidomastoid area.  Chest and Lung Exam Auscultation: Breath Sounds:-Normal.  Cardiovascular Auscultation:Rythm- Regular. Murmurs & Other Heart Sounds:Auscultation of the heart reveals- No Murmurs.  Neurologic Cranial Nerve exam:- CN III-XII intact(No nystagmus), symmetric smile. Strength:- 5/5 equal and symmetric strength both upper and lower extremities.       Assessment & Plan:   Patient Instructions  Left side neck pain low level for one month. No carotid bruit. Pain described in area of sternocleidomastoid. Probable muscle strain. Recommend prescribing diclofenac 75 mg twice daily and rx flexeril to use at night.  Will see  how you do. If pain persist consider referral to sport medicine MD and/or imaging studies.  Follow up 2 weeks or sooner if needed.   Mackie Pai, PA-C

## 2023-01-24 ENCOUNTER — Ambulatory Visit (HOSPITAL_BASED_OUTPATIENT_CLINIC_OR_DEPARTMENT_OTHER)
Admission: RE | Admit: 2023-01-24 | Discharge: 2023-01-24 | Disposition: A | Discharge status: Home / Self Care | Payer: 59 | Source: Ambulatory Visit | Attending: Medical | Admitting: Medical

## 2023-01-24 ENCOUNTER — Ambulatory Visit (INDEPENDENT_AMBULATORY_CARE_PROVIDER_SITE_OTHER): Payer: 59 | Admitting: Medical

## 2023-01-24 VITALS — BP 120/80 | HR 76 | Temp 98.0°F | Resp 18 | Ht 68.0 in | Wt 160.8 lb

## 2023-01-24 DIAGNOSIS — R5383 Other fatigue: Secondary | ICD-10-CM

## 2023-01-24 DIAGNOSIS — R739 Hyperglycemia, unspecified: Secondary | ICD-10-CM

## 2023-01-24 DIAGNOSIS — M542 Cervicalgia: Secondary | ICD-10-CM

## 2023-01-24 DIAGNOSIS — Z0000 Encounter for general adult medical examination without abnormal findings: Secondary | ICD-10-CM | POA: Diagnosis not present

## 2023-01-24 DIAGNOSIS — R591 Generalized enlarged lymph nodes: Secondary | ICD-10-CM | POA: Diagnosis not present

## 2023-01-24 DIAGNOSIS — R221 Localized swelling, mass and lump, neck: Secondary | ICD-10-CM | POA: Insufficient documentation

## 2023-01-24 NOTE — Addendum Note (Signed)
Addended by: Gwenevere Abbot on: 01/24/2023 09:08 PM   Modules accepted: Orders

## 2023-01-24 NOTE — Progress Notes (Addendum)
Subjective:    Patient ID: Joseph Coleman, male    DOB: February 13, 1981, 42 y.o.   MRN: [id number]  HPI  Pt in for cpe/wellness exam. Pt not fasting.   Pt works prn as MA.   Pt does exercise regularly. Pt trying to eat healthy. Nonsmoker and no alcohol. 1-2 cups of coffee a day.   Advise pt to consider getting flu vaccines in fall. Sept- October.  Pt also mentions some left side neck pain. He states still persists. On review A/P was below in"  "Left side neck pain low level for one month. No carotid bruit. Pain described in area of sternocleidomastoid. Probable muscle strain. Recommend prescribing diclofenac 75 mg twice daily and rx flexeril to use at night.  Will see how you do. If pain persist consider referral to sport medicine MD and/or imaging studies.   Follow up 2 weeks or sooner if needed."  Looks like did not have follow up as requested. But still having same discomfort in same area.      Review of Systems  Constitutional:  Negative for chills, fatigue and fever.  HENT:  Negative for congestion, ear discharge, ear pain, postnasal drip and sinus pressure.   Respiratory:  Negative for cough, chest tightness and wheezing.   Cardiovascular:  Negative for chest pain and palpitations.  Gastrointestinal:  Negative for abdominal pain, constipation and nausea.  Genitourinary:  Negative for dysuria, frequency, penile pain and testicular pain.  Musculoskeletal:  Negative for back pain and myalgias.  Skin:  Negative for rash.  Neurological:  Negative for dizziness, seizures, weakness and headaches.  Hematological:  Negative for adenopathy. Does not bruise/bleed easily.  Psychiatric/Behavioral:  Negative for agitation, confusion and sleep disturbance. The patient is not nervous/anxious.     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  Tobacco Use   Smoking status: Former    Types: Cigarettes   Smokeless tobacco: Never  Vaping Use   Vaping status: Never Used  Substance and Sexual Activity   Alcohol use: No    Alcohol/week: 0.0 standard drinks of alcohol   Drug use: No   Sexual activity: Yes    Partners: Female  Other Topics Concern   Not on file  Social History Narrative   Not on file   Social Determinants of Health   Financial Resource Strain: Not on file  Food Insecurity: Not on file  Transportation Needs: Not on file  Physical Activity: Not on file  Stress: Not on file  Social Connections: Not on file  Intimate Partner Violence: Not on file    Past Surgical History:  Procedure Laterality Date   lipoma removal     WISDOM TOOTH EXTRACTION  2015    Family History  Problem Relation Age of Onset   Hyperlipidemia Mother    Hypertension Mother    Diabetes Father    Alcohol abuse Sister    Stroke Maternal Grandfather    Heart attack Paternal Aunt    Cerebral palsy Son 8   Premature birth Son 8   Healthy Daughter 2   Cancer Neg Hx    Colon cancer Neg Hx  Esophageal cancer Neg Hx    Stomach cancer Neg Hx    Rectal cancer Neg Hx     Allergies  Allergen Reactions   Fish Allergy    Food [Shellfish Allergy]    Hydrocodone Bit-Homatrop Mbr Rash    Current Outpatient Medications on File Prior to Visit  Medication Sig Dispense Refill   Multiple Vitamin (MULTIVITAMIN) tablet Take 1 tablet by mouth as needed.     No current facility-administered medications on file prior to visit.    BP 120/80   Pulse 76   Temp 98 F (36.7 C)   Resp 18   Ht 5\' 8"  (1.727 m)   Wt 160 lb 12.8 oz (72.9 kg)   SpO2 99%   BMI 24.45 kg/m        Objective:   Physical Exam  General Mental Status- Alert. General Appearance- Not in acute distress.    Skin General: Color- Normal Color. Moisture- Normal Moisture.  Neck  No carotid bruits. No JVD. Left side of neck- no obvious mass or lump. Area of discmfort mid sternocleidomastoid area. Possible small lymph node or lump. Normal color to skin. No redness.  No thyromegaly.  Chest and Lung Exam Auscultation: Breath Sounds:-Normal.  Cardiovascular Auscultation:Rythm- Regular. Murmurs & Other Heart Sounds:Auscultation of the heart reveals- No Murmurs.  Abdomen Inspection:-Inspeection Normal. Palpation/Percussion:Note:No mass. Palpation and Percussion of the abdomen reveal- Non Tender, Non Distended + BS, no rebound or guarding.   Neurologic Cranial Nerve exam:- CN III-XII intact(No nystagmus), symmetric smile. Strength:- 5/5 equal and symmetric strength both upper and lower extremities.       Assessment & Plan:   Patient Instructions  For you wellness exam today I have ordered cbc, cmp and lipid panel.  Declined flu vaccine.  Recommend exercise and healthy diet.  We will let you know lab results as they come in.  Follow up date appointment will be determined after lab review.      2. Elevated blood sugar - Hemoglobin A1c; Future  3. Fatigue, unspecified type - TSH; Future - T4, free; Future  4. Lymphadenopathy - US Soft Tissue Head/Neck (NON-THYROID); Future - CBC with Differential/Platelet; Future  5. Lump in neck vs lymphadenopathy vs sternocleidomastoid muscle pain though is prolonged/since last year. - US Soft Tissue Head/Neck (NON-THYROID); Future    Please go ahead and go downstairs to get scheduled for [place]. Please get study today or at latest Wednesday. Want to review [place] then decide on possible referral sports med or ENT.  Follow up date to be determined after lab review.         Esperanza Richters, PA-C

## 2023-01-24 NOTE — Patient Instructions (Addendum)
For you wellness exam today I have ordered cbc, cmp and lipid panel.  Declined flu vaccine.  Recommend exercise and healthy diet.  We will let you know lab results as they come in.  Follow up date appointment will be determined after lab review.      2. Elevated blood sugar - Hemoglobin A1c; Future  3. Fatigue, unspecified type - TSH; Future - T4, free; Future  4. Lymphadenopathy - US Soft Tissue Head/Neck (NON-THYROID); Future - CBC with Differential/Platelet; Future  5. Lump in neck vs lymphadenopathy vs sternocleidomastoid muscle pain though is prolonged/since last year. - US Soft Tissue Head/Neck (NON-THYROID); Future    Please go ahead and go downstairs to get scheduled for [place]. Please get study today or at latest Wednesday. Want to review [place] then decide on possible referral sports med or ENT.  Follow up date to be determined after lab review.     Preventive Care 70-18 Years Old, Male Preventive care refers to lifestyle choices and visits with your health care provider that can promote health and wellness. Preventive care visits are also called wellness exams. What can I expect for my preventive care visit? Counseling During your preventive care visit, your health care provider may ask about your: Medical history, including: Past medical problems. Family medical history. Current health, including: Emotional well-being. Home life and relationship well-being. Sexual activity. Lifestyle, including: Alcohol, nicotine or tobacco, and drug use. Access to firearms. Diet, exercise, and sleep habits. Safety issues such as seatbelt and bike helmet use. Sunscreen use. Work and work Astronomer. Physical exam Your health care provider will check your: Height and weight. These may be used to calculate your BMI (body mass index). BMI is a measurement that tells if you are at a healthy weight. Waist circumference. This measures the distance around your waistline. This  measurement also tells if you are at a healthy weight and may help predict your risk of certain diseases, such as type 2 diabetes and high blood pressure. Heart rate and blood pressure. Body temperature. Skin for abnormal spots. What immunizations do I need?  Vaccines are usually given at various ages, according to a schedule. Your health care provider will recommend vaccines for you based on your age, medical history, and lifestyle or other factors, such as travel or where you work. What tests do I need? Screening Your health care provider may recommend screening tests for certain conditions. This may include: Lipid and cholesterol levels. Diabetes screening. This is done by checking your blood sugar (glucose) after you have not eaten for a while (fasting). Hepatitis B test. Hepatitis C test. HIV (human immunodeficiency virus) test. STI (sexually transmitted infection) testing, if you are at risk. Lung cancer screening. Prostate cancer screening. Colorectal cancer screening. Talk with your health care provider about your test results, treatment options, and if necessary, the need for more tests. Follow these instructions at home: Eating and drinking  Eat a diet that includes fresh fruits and vegetables, whole grains, lean protein, and low-fat dairy products. Take vitamin and mineral supplements as recommended by your health care provider. Do not drink alcohol if your health care provider tells you not to drink. If you drink alcohol: Limit how much you have to 0-2 drinks a day. Know how much alcohol is in your drink. In the [place] one drink equals one 12 oz bottle of beer (355 mL), one 5 oz glass of wine (148 mL), or one 1 oz glass of hard liquor (44 mL). Lifestyle Brush your  teeth every morning and night with fluoride toothpaste. Floss one time each day. Exercise for at least 30 minutes 5 or more days each week. Do not use any products that contain nicotine or tobacco. These products  include cigarettes, chewing tobacco, and vaping devices, such as e-cigarettes. If you need help quitting, ask your health care provider. Do not use drugs. If you are sexually active, practice safe sex. Use a condom or other form of protection to prevent STIs. Take aspirin only as told by your health care provider. Make sure that you understand how much to take and what form to take. Work with your health care provider to find out whether it is safe and beneficial for you to take aspirin daily. Find healthy ways to manage stress, such as: Meditation, yoga, or listening to music. Journaling. Talking to a trusted person. Spending time with friends and family. Minimize exposure to UV radiation to reduce your risk of skin cancer. Safety Always wear your seat belt while driving or riding in a vehicle. Do not drive: If you have been drinking alcohol. Do not ride with someone who has been drinking. When you are tired or distracted. While texting. If you have been using any mind-altering substances or drugs. Wear a helmet and other protective equipment during sports activities. If you have firearms in your house, make sure you follow all gun safety procedures. What's next? Go to your health care provider once a year for an annual wellness visit. Ask your health care provider how often you should have your eyes and teeth checked. Stay up to date on all vaccines. This information is not intended to replace advice given to you by your health care provider. Make sure you discuss any questions you have with your health care provider. Document Revised: 11/12/2020 Document Reviewed: 11/12/2020 Elsevier Patient Education  2024 [employer].

## 2023-01-25 ENCOUNTER — Other Ambulatory Visit (INDEPENDENT_AMBULATORY_CARE_PROVIDER_SITE_OTHER): Payer: 59

## 2023-01-25 ENCOUNTER — Other Ambulatory Visit: Payer: 59

## 2023-01-25 DIAGNOSIS — Z0000 Encounter for general adult medical examination without abnormal findings: Secondary | ICD-10-CM | POA: Diagnosis not present

## 2023-01-25 DIAGNOSIS — R739 Hyperglycemia, unspecified: Secondary | ICD-10-CM

## 2023-01-25 DIAGNOSIS — R5383 Other fatigue: Secondary | ICD-10-CM

## 2023-01-25 LAB — COMPREHENSIVE METABOLIC PANEL
ALT: 29 U/L (ref 0–53)
AST: 26 U/L (ref 0–37)
Albumin: 4.6 g/dL (ref 3.5–5.2)
Alkaline Phosphatase: 45 U/L (ref 39–117)
BUN: 21 mg/dL (ref 6–23)
CO2: 26 mEq/L (ref 19–32)
Calcium: 9.5 mg/dL (ref 8.4–10.5)
Chloride: 103 mEq/L (ref 96–112)
Creatinine, Ser: 1.16 mg/dL (ref 0.40–1.50)
GFR: 77.34 mL/min (ref >60.00)
Glucose, Bld: 87 mg/dL (ref 70–99)
Potassium: 4.1 mEq/L (ref 3.5–5.1)
Sodium: 139 mEq/L (ref 135–145)
Total Bilirubin: 0.6 mg/dL (ref 0.2–1.2)
Total Protein: 7.4 g/dL (ref 6.0–8.3)

## 2023-01-25 LAB — CBC WITH DIFFERENTIAL/PLATELET
Basophils Absolute: 0 10*3/uL (ref 0.0–0.1)
Basophils Relative: 0.3 % (ref 0.0–3.0)
Eosinophils Absolute: 0.1 10*3/uL (ref 0.0–0.7)
Eosinophils Relative: 2 % (ref 0.0–5.0)
HCT: 41.9 % (ref 39.0–52.0)
Hemoglobin: 13.1 g/dL (ref 13.0–17.0)
Lymphocytes Relative: 28.3 % (ref 12.0–46.0)
Lymphs Abs: 2 10*3/uL (ref 0.7–4.0)
MCHC: 31.2 g/dL (ref 30.0–36.0)
MCV: 66.9 fl — ABNORMAL LOW (ref 78.0–100.0)
Monocytes Absolute: 0.6 10*3/uL (ref 0.1–1.0)
Monocytes Relative: 8.6 % (ref 3.0–12.0)
Neutro Abs: 4.3 10*3/uL (ref 1.4–7.7)
Neutrophils Relative %: 60.8 % (ref 43.0–77.0)
Platelets: 303 10*3/uL (ref 150.0–400.0)
RBC: 6.26 Mil/uL — ABNORMAL HIGH (ref 4.22–5.81)
RDW: 15 % (ref 11.5–15.5)
WBC: 7.1 10*3/uL (ref 4.0–10.5)

## 2023-01-25 LAB — LIPID PANEL
Cholesterol: 217 mg/dL — ABNORMAL HIGH (ref 0–200)
HDL: 62.2 mg/dL (ref >39.00)
LDL Cholesterol: 142 mg/dL — ABNORMAL HIGH (ref 0–99)
NonHDL: 154.86
Total CHOL/HDL Ratio: 3
Triglycerides: 62 mg/dL (ref 0.0–149.0)
VLDL: 12.4 mg/dL (ref 0.0–40.0)

## 2023-01-25 LAB — T4, FREE: Free T4: 1.02 ng/dL (ref 0.60–1.60)

## 2023-01-25 LAB — TSH: TSH: 0.47 u[IU]/mL (ref 0.35–5.50)

## 2023-01-26 LAB — HEMOGLOBIN A1C
Hgb A1c MFr Bld: 5.9 %{Hb} — ABNORMAL HIGH (ref <5.7)
Mean Plasma Glucose: 123 mg/dL
eAG (mmol/L): 6.8 mmol/L

## 2024-03-01 DIAGNOSIS — H52223 Regular astigmatism, bilateral: Secondary | ICD-10-CM | POA: Diagnosis not present

## 2024-03-01 DIAGNOSIS — H524 Presbyopia: Secondary | ICD-10-CM | POA: Diagnosis not present

## 2024-03-01 DIAGNOSIS — H5213 Myopia, bilateral: Secondary | ICD-10-CM | POA: Diagnosis not present

## 2024-03-30 ENCOUNTER — Encounter (INDEPENDENT_AMBULATORY_CARE_PROVIDER_SITE_OTHER): Payer: Self-pay
